# Patient Record
Sex: Male | Born: 1945 | Hispanic: No
Health system: Southern US, Community
[De-identification: ages and names within clinical notes are randomized; demographics above are authoritative.]

## PROBLEM LIST (undated history)

## (undated) DIAGNOSIS — M623 Immobility syndrome (paraplegic): Secondary | ICD-10-CM

## (undated) DIAGNOSIS — Z950 Presence of cardiac pacemaker: Secondary | ICD-10-CM

## (undated) DIAGNOSIS — C801 Malignant (primary) neoplasm, unspecified: Secondary | ICD-10-CM

## (undated) DIAGNOSIS — E7800 Pure hypercholesterolemia, unspecified: Secondary | ICD-10-CM

## (undated) HISTORY — PX: OTHER SURGICAL HISTORY: SHX169

## (undated) HISTORY — PX: PACEMAKER INSERTION: SHX728

## (undated) HISTORY — PX: PANCREAS SURGERY: SHX731

---

## 2004-04-29 ENCOUNTER — Emergency Department: Payer: Self-pay | Admitting: Emergency Medicine

## 2004-04-30 ENCOUNTER — Inpatient Hospital Stay: Payer: Self-pay | Admitting: Anesthesiology

## 2004-06-06 ENCOUNTER — Emergency Department: Payer: Self-pay | Admitting: Emergency Medicine

## 2004-06-24 ENCOUNTER — Emergency Department: Payer: Self-pay | Admitting: Emergency Medicine

## 2004-07-24 ENCOUNTER — Emergency Department: Payer: Self-pay | Admitting: Emergency Medicine

## 2004-08-15 ENCOUNTER — Emergency Department: Payer: Self-pay | Admitting: Emergency Medicine

## 2006-02-26 ENCOUNTER — Encounter
Admission: RE | Admit: 2006-02-26 | Discharge: 2006-03-25 | Payer: Self-pay | Admitting: Physical Medicine and Rehabilitation

## 2007-05-18 ENCOUNTER — Inpatient Hospital Stay: Payer: Self-pay | Admitting: Specialist

## 2007-05-18 ENCOUNTER — Other Ambulatory Visit: Payer: Self-pay

## 2007-05-26 ENCOUNTER — Ambulatory Visit: Payer: Self-pay | Admitting: Family Medicine

## 2009-11-05 ENCOUNTER — Inpatient Hospital Stay: Payer: Self-pay | Admitting: *Deleted

## 2010-08-14 ENCOUNTER — Ambulatory Visit: Payer: Self-pay | Admitting: Family Medicine

## 2010-11-29 ENCOUNTER — Emergency Department: Payer: Self-pay | Admitting: Emergency Medicine

## 2011-04-22 ENCOUNTER — Inpatient Hospital Stay: Payer: Self-pay | Admitting: Internal Medicine

## 2011-04-22 DIAGNOSIS — I499 Cardiac arrhythmia, unspecified: Secondary | ICD-10-CM

## 2012-06-29 ENCOUNTER — Emergency Department: Payer: Self-pay | Admitting: Emergency Medicine

## 2012-06-29 LAB — BASIC METABOLIC PANEL
BUN: 18 mg/dL (ref 7–18)
Calcium, Total: 9.1 mg/dL (ref 8.5–10.1)
Chloride: 104 mmol/L (ref 98–107)
Co2: 22 mmol/L (ref 21–32)
Potassium: 3.7 mmol/L (ref 3.5–5.1)
Sodium: 137 mmol/L (ref 136–145)

## 2012-06-29 LAB — CBC
HCT: 40.5 % (ref 40.0–52.0)
HGB: 12.8 g/dL — ABNORMAL LOW (ref 13.0–18.0)
MCH: 28 pg (ref 26.0–34.0)
MCV: 89 fL (ref 80–100)
RBC: 4.57 10*6/uL (ref 4.40–5.90)
RDW: 14.4 % (ref 11.5–14.5)

## 2012-07-04 LAB — CULTURE, BLOOD (SINGLE)

## 2012-12-23 ENCOUNTER — Ambulatory Visit: Payer: Self-pay

## 2013-01-22 ENCOUNTER — Ambulatory Visit: Payer: Self-pay | Admitting: Gastroenterology

## 2013-01-26 ENCOUNTER — Ambulatory Visit: Payer: Self-pay | Admitting: Urology

## 2013-01-26 LAB — BASIC METABOLIC PANEL
BUN: 12 mg/dL (ref 7–18)
Chloride: 107 mmol/L (ref 98–107)
Co2: 23 mmol/L (ref 21–32)
Creatinine: 0.96 mg/dL (ref 0.60–1.30)
Glucose: 138 mg/dL — ABNORMAL HIGH (ref 65–99)
Potassium: 3.4 mmol/L — ABNORMAL LOW (ref 3.5–5.1)

## 2013-01-26 LAB — CBC WITH DIFFERENTIAL/PLATELET
Basophil %: 0.7 %
Eosinophil %: 7.7 %
HCT: 35.9 % — ABNORMAL LOW (ref 40.0–52.0)
HGB: 12.2 g/dL — ABNORMAL LOW (ref 13.0–18.0)
Lymphocyte #: 1.9 10*3/uL (ref 1.0–3.6)
MCHC: 33.9 g/dL (ref 32.0–36.0)
MCV: 86 fL (ref 80–100)
RBC: 4.15 10*6/uL — ABNORMAL LOW (ref 4.40–5.90)

## 2013-02-06 ENCOUNTER — Emergency Department: Payer: Self-pay | Admitting: Emergency Medicine

## 2013-02-06 LAB — URINALYSIS, COMPLETE
Glucose,UR: NEGATIVE mg/dL (ref 0–75)
Ph: 6 (ref 4.5–8.0)
Protein: 30
RBC,UR: 174 /HPF (ref 0–5)
Specific Gravity: 1.01 (ref 1.003–1.030)
Squamous Epithelial: 1
WBC UR: 85 /HPF (ref 0–5)

## 2013-02-06 LAB — COMPREHENSIVE METABOLIC PANEL
Albumin: 3 g/dL — ABNORMAL LOW (ref 3.4–5.0)
Alkaline Phosphatase: 96 U/L (ref 50–136)
BUN: 24 mg/dL — ABNORMAL HIGH (ref 7–18)
Chloride: 105 mmol/L (ref 98–107)
Co2: 22 mmol/L (ref 21–32)
EGFR (Non-African Amer.): 50 — ABNORMAL LOW
Glucose: 88 mg/dL (ref 65–99)
SGPT (ALT): 38 U/L (ref 12–78)
Sodium: 134 mmol/L — ABNORMAL LOW (ref 136–145)
Total Protein: 7.5 g/dL (ref 6.4–8.2)

## 2013-02-06 LAB — CBC WITH DIFFERENTIAL/PLATELET
Basophil %: 1.1 %
Eosinophil #: 0.9 10*3/uL — ABNORMAL HIGH (ref 0.0–0.7)
Eosinophil %: 5.7 %
HCT: 37.1 % — ABNORMAL LOW (ref 40.0–52.0)
HGB: 12.7 g/dL — ABNORMAL LOW (ref 13.0–18.0)
Lymphocyte %: 16 %
MCHC: 34.1 g/dL (ref 32.0–36.0)
MCV: 87 fL (ref 80–100)
Monocyte #: 1.1 x10 3/mm — ABNORMAL HIGH (ref 0.2–1.0)
Monocyte %: 7.5 %
Neutrophil #: 10.5 10*3/uL — ABNORMAL HIGH (ref 1.4–6.5)
Neutrophil %: 69.7 %
WBC: 15 10*3/uL — ABNORMAL HIGH (ref 3.8–10.6)

## 2013-02-09 ENCOUNTER — Ambulatory Visit: Payer: Self-pay | Admitting: Urology

## 2013-02-09 LAB — PROTIME-INR
INR: 1.2
Prothrombin Time: 15.1 secs — ABNORMAL HIGH (ref 11.5–14.7)

## 2013-02-27 ENCOUNTER — Ambulatory Visit: Payer: Self-pay | Admitting: Surgery

## 2013-02-27 ENCOUNTER — Ambulatory Visit: Payer: Self-pay | Admitting: Internal Medicine

## 2013-03-04 ENCOUNTER — Inpatient Hospital Stay: Payer: Self-pay | Admitting: Internal Medicine

## 2013-03-04 LAB — URINALYSIS, COMPLETE
Glucose,UR: NEGATIVE mg/dL (ref 0–75)
Hyaline Cast: 2
Ketone: NEGATIVE
Protein: 100
Specific Gravity: 1.01 (ref 1.003–1.030)
Squamous Epithelial: <1

## 2013-03-04 LAB — CBC WITH DIFFERENTIAL/PLATELET
Basophil #: 0 10*3/uL (ref 0.0–0.1)
Basophil %: 0.2 %
Eosinophil #: 0.3 10*3/uL (ref 0.0–0.7)
Eosinophil %: 1.4 %
HCT: 37.6 % — ABNORMAL LOW (ref 40.0–52.0)
HGB: 12.6 g/dL — ABNORMAL LOW (ref 13.0–18.0)
Lymphocyte #: 1.4 10*3/uL (ref 1.0–3.6)
Lymphocyte %: 6.4 %
MCH: 29.1 pg (ref 26.0–34.0)
MCHC: 33.5 g/dL (ref 32.0–36.0)
MCV: 87 fL (ref 80–100)
Monocyte #: 1.5 x10 3/mm — ABNORMAL HIGH (ref 0.2–1.0)
Monocyte %: 6.9 %
Neutrophil #: 18.8 10*3/uL — ABNORMAL HIGH (ref 1.4–6.5)
Neutrophil %: 85.1 %
Platelet: 484 10*3/uL — ABNORMAL HIGH (ref 150–440)
RBC: 4.33 10*6/uL — ABNORMAL LOW (ref 4.40–5.90)
RDW: 14.9 % — ABNORMAL HIGH (ref 11.5–14.5)
WBC: 22.1 10*3/uL — ABNORMAL HIGH (ref 3.8–10.6)

## 2013-03-04 LAB — COMPREHENSIVE METABOLIC PANEL
Albumin: 2.8 g/dL — ABNORMAL LOW (ref 3.4–5.0)
Alkaline Phosphatase: 109 U/L (ref 50–136)
Anion Gap: 9 (ref 7–16)
BUN: 21 mg/dL — ABNORMAL HIGH (ref 7–18)
Bilirubin,Total: 0.8 mg/dL (ref 0.2–1.0)
Calcium, Total: 8.7 mg/dL (ref 8.5–10.1)
Chloride: 104 mmol/L (ref 98–107)
Co2: 22 mmol/L (ref 21–32)
Creatinine: 1.49 mg/dL — ABNORMAL HIGH (ref 0.60–1.30)
EGFR (African American): 57 — ABNORMAL LOW
EGFR (Non-African Amer.): 49 — ABNORMAL LOW
Glucose: 116 mg/dL — ABNORMAL HIGH (ref 65–99)
Osmolality: 274 (ref 275–301)
Potassium: 3.6 mmol/L (ref 3.5–5.1)
SGOT(AST): 24 U/L (ref 15–37)
SGPT (ALT): 31 U/L (ref 12–78)
Sodium: 135 mmol/L — ABNORMAL LOW (ref 136–145)
Total Protein: 7.5 g/dL (ref 6.4–8.2)

## 2013-03-04 LAB — PROTIME-INR: Prothrombin Time: 22.8 secs — ABNORMAL HIGH (ref 11.5–14.7)

## 2013-03-05 LAB — BASIC METABOLIC PANEL
Anion Gap: 10 (ref 7–16)
BUN: 20 mg/dL — ABNORMAL HIGH (ref 7–18)
Calcium, Total: 9 mg/dL (ref 8.5–10.1)
Co2: 21 mmol/L (ref 21–32)
Creatinine: 1.25 mg/dL (ref 0.60–1.30)
EGFR (African American): >60
EGFR (Non-African Amer.): >60
Osmolality: 270 (ref 275–301)
Potassium: 3.7 mmol/L (ref 3.5–5.1)

## 2013-03-05 LAB — CBC WITH DIFFERENTIAL/PLATELET
Eosinophil #: 0.3 10*3/uL (ref 0.0–0.7)
Eosinophil %: 1.6 %
HGB: 12.4 g/dL — ABNORMAL LOW (ref 13.0–18.0)
Lymphocyte #: 1.7 10*3/uL (ref 1.0–3.6)
Lymphocyte %: 7.9 %
MCHC: 34.2 g/dL (ref 32.0–36.0)
MCV: 85 fL (ref 80–100)
Monocyte %: 6.8 %
Platelet: 493 10*3/uL — ABNORMAL HIGH (ref 150–440)
RBC: 4.24 10*6/uL — ABNORMAL LOW (ref 4.40–5.90)
RDW: 14.7 % — ABNORMAL HIGH (ref 11.5–14.5)
WBC: 21 10*3/uL — ABNORMAL HIGH (ref 3.8–10.6)

## 2013-03-05 LAB — PROTIME-INR: INR: 3.4

## 2013-03-06 LAB — CBC WITH DIFFERENTIAL/PLATELET
Basophil #: 0 10*3/uL (ref 0.0–0.1)
Basophil %: 0.1 %
Eosinophil #: 0 10*3/uL (ref 0.0–0.7)
Eosinophil %: 0 %
HCT: 35.2 % — ABNORMAL LOW (ref 40.0–52.0)
HGB: 12 g/dL — ABNORMAL LOW (ref 13.0–18.0)
Lymphocyte #: 0.9 10*3/uL — ABNORMAL LOW (ref 1.0–3.6)
Lymphocyte %: 8 %
MCH: 29.3 pg (ref 26.0–34.0)
MCHC: 34.2 g/dL (ref 32.0–36.0)
MCV: 85 fL (ref 80–100)
Monocyte #: 0.1 x10 3/mm — ABNORMAL LOW (ref 0.2–1.0)
Monocyte %: 1.2 %
Neutrophil #: 10.2 10*3/uL — ABNORMAL HIGH (ref 1.4–6.5)
Neutrophil %: 90.7 %
Platelet: 557 10*3/uL — ABNORMAL HIGH (ref 150–440)
RBC: 4.12 10*6/uL — ABNORMAL LOW (ref 4.40–5.90)
RDW: 14.8 % — ABNORMAL HIGH (ref 11.5–14.5)
WBC: 11.2 10*3/uL — ABNORMAL HIGH (ref 3.8–10.6)

## 2013-03-06 LAB — BASIC METABOLIC PANEL
Anion Gap: 11 (ref 7–16)
BUN: 16 mg/dL (ref 7–18)
Calcium, Total: 8.6 mg/dL (ref 8.5–10.1)
Co2: 21 mmol/L (ref 21–32)
Creatinine: 1.17 mg/dL (ref 0.60–1.30)
EGFR (African American): >60
EGFR (Non-African Amer.): >60
Osmolality: 277 (ref 275–301)
Potassium: 3.6 mmol/L (ref 3.5–5.1)
Sodium: 137 mmol/L (ref 136–145)

## 2013-03-06 LAB — PROTIME-INR
INR: 3
Prothrombin Time: 30.4 secs — ABNORMAL HIGH (ref 11.5–14.7)

## 2013-03-07 LAB — PROTIME-INR: Prothrombin Time: 22 secs — ABNORMAL HIGH (ref 11.5–14.7)

## 2013-03-08 LAB — CBC WITH DIFFERENTIAL/PLATELET
Basophil #: 0 10*3/uL (ref 0.0–0.1)
Basophil %: 0.1 %
Eosinophil #: 0 10*3/uL (ref 0.0–0.7)
Lymphocyte #: 1.9 10*3/uL (ref 1.0–3.6)
Lymphocyte %: 13.3 %
MCH: 29.3 pg (ref 26.0–34.0)
Monocyte #: 1.1 x10 3/mm — ABNORMAL HIGH (ref 0.2–1.0)
Monocyte %: 7.9 %
Platelet: 512 10*3/uL — ABNORMAL HIGH (ref 150–440)
RBC: 3.85 10*6/uL — ABNORMAL LOW (ref 4.40–5.90)
RDW: 14.8 % — ABNORMAL HIGH (ref 11.5–14.5)
WBC: 14.1 10*3/uL — ABNORMAL HIGH (ref 3.8–10.6)

## 2013-03-08 LAB — BASIC METABOLIC PANEL
BUN: 21 mg/dL — ABNORMAL HIGH (ref 7–18)
Calcium, Total: 8 mg/dL — ABNORMAL LOW (ref 8.5–10.1)
Chloride: 105 mmol/L (ref 98–107)
Co2: 22 mmol/L (ref 21–32)
Glucose: 99 mg/dL (ref 65–99)
Potassium: 3.3 mmol/L — ABNORMAL LOW (ref 3.5–5.1)

## 2013-03-09 LAB — BASIC METABOLIC PANEL
Anion Gap: 6 — ABNORMAL LOW (ref 7–16)
Calcium, Total: 7.9 mg/dL — ABNORMAL LOW (ref 8.5–10.1)
Creatinine: 1.13 mg/dL (ref 0.60–1.30)
EGFR (African American): >60
EGFR (Non-African Amer.): >60
Glucose: 97 mg/dL (ref 65–99)
Potassium: 3.2 mmol/L — ABNORMAL LOW (ref 3.5–5.1)

## 2013-03-09 LAB — CBC WITH DIFFERENTIAL/PLATELET
Basophil #: 0 10*3/uL (ref 0.0–0.1)
Basophil %: 0.1 %
Eosinophil %: 1.3 %
HCT: 33.1 % — ABNORMAL LOW (ref 40.0–52.0)
HGB: 11.3 g/dL — ABNORMAL LOW (ref 13.0–18.0)
Lymphocyte #: 1.6 10*3/uL (ref 1.0–3.6)
MCH: 29.3 pg (ref 26.0–34.0)
MCHC: 34.2 g/dL (ref 32.0–36.0)
MCV: 86 fL (ref 80–100)
Monocyte #: 1 x10 3/mm (ref 0.2–1.0)
Neutrophil #: 12.1 10*3/uL — ABNORMAL HIGH (ref 1.4–6.5)
RBC: 3.86 10*6/uL — ABNORMAL LOW (ref 4.40–5.90)
WBC: 14.9 10*3/uL — ABNORMAL HIGH (ref 3.8–10.6)

## 2013-03-09 LAB — PROTIME-INR: Prothrombin Time: 18.7 secs — ABNORMAL HIGH (ref 11.5–14.7)

## 2013-03-10 LAB — CBC WITH DIFFERENTIAL/PLATELET
Basophil #: 0 10*3/uL (ref 0.0–0.1)
Basophil %: 0.2 %
Eosinophil #: 0.7 10*3/uL (ref 0.0–0.7)
Eosinophil %: 4.2 %
HCT: 32.7 % — ABNORMAL LOW (ref 40.0–52.0)
HGB: 11.2 g/dL — ABNORMAL LOW (ref 13.0–18.0)
Lymphocyte #: 1.7 10*3/uL (ref 1.0–3.6)
Lymphocyte %: 10.7 %
MCH: 29.2 pg (ref 26.0–34.0)
MCV: 85 fL (ref 80–100)
Monocyte #: 1 x10 3/mm (ref 0.2–1.0)
Neutrophil %: 78.7 %
Platelet: 459 10*3/uL — ABNORMAL HIGH (ref 150–440)
RDW: 14.8 % — ABNORMAL HIGH (ref 11.5–14.5)
WBC: 15.5 10*3/uL — ABNORMAL HIGH (ref 3.8–10.6)

## 2013-03-10 LAB — BASIC METABOLIC PANEL
Anion Gap: 9 (ref 7–16)
BUN: 17 mg/dL (ref 7–18)
Calcium, Total: 8.1 mg/dL — ABNORMAL LOW (ref 8.5–10.1)
Co2: 24 mmol/L (ref 21–32)
Creatinine: 0.99 mg/dL (ref 0.60–1.30)
EGFR (Non-African Amer.): >60
Sodium: 135 mmol/L — ABNORMAL LOW (ref 136–145)

## 2013-03-11 LAB — BASIC METABOLIC PANEL WITH GFR
Anion Gap: 10
BUN: 13 mg/dL
Calcium, Total: 7.7 mg/dL — ABNORMAL LOW
Chloride: 102 mmol/L
Co2: 24 mmol/L
Creatinine: 0.99 mg/dL
EGFR (African American): >60
EGFR (Non-African Amer.): >60
Glucose: 86 mg/dL
Osmolality: 271
Potassium: 3 mmol/L — ABNORMAL LOW
Sodium: 136 mmol/L

## 2013-03-11 LAB — CBC WITH DIFFERENTIAL/PLATELET
Basophil #: 0 x10 3/mm 3
Basophil %: 0.1 %
Eosinophil #: 1.2 x10 3/mm 3 — ABNORMAL HIGH
Eosinophil %: 7.2 %
HCT: 32 % — ABNORMAL LOW
HGB: 10.8 g/dL — ABNORMAL LOW
Lymphocyte %: 10.2 %
Lymphs Abs: 1.7 x10 3/mm 3
MCH: 29.1 pg
MCHC: 33.6 g/dL
MCV: 87 fL
Monocyte #: 0.8 "x10 3/mm "
Monocyte %: 4.7 %
Neutrophil #: 12.9 x10 3/mm 3 — ABNORMAL HIGH
Neutrophil %: 77.8 %
Platelet: 416 x10 3/mm 3
RBC: 3.7 x10 6/mm 3 — ABNORMAL LOW
RDW: 14.7 % — ABNORMAL HIGH
WBC: 16.6 x10 3/mm 3 — ABNORMAL HIGH

## 2013-03-11 LAB — PROTIME-INR: INR: 1.7

## 2013-03-12 LAB — CBC WITH DIFFERENTIAL/PLATELET
Basophil %: 0.2 %
Eosinophil #: 1.3 10*3/uL — ABNORMAL HIGH (ref 0.0–0.7)
Eosinophil %: 7.6 %
HCT: 32.8 % — ABNORMAL LOW (ref 40.0–52.0)
Lymphocyte %: 12.3 %
MCH: 29.3 pg (ref 26.0–34.0)
MCV: 86 fL (ref 80–100)
Monocyte #: 0.9 x10 3/mm (ref 0.2–1.0)
Neutrophil #: 12.4 10*3/uL — ABNORMAL HIGH (ref 1.4–6.5)
Neutrophil %: 74.7 %
RBC: 3.79 10*6/uL — ABNORMAL LOW (ref 4.40–5.90)
RDW: 14.5 % (ref 11.5–14.5)
WBC: 16.6 10*3/uL — ABNORMAL HIGH (ref 3.8–10.6)

## 2013-03-12 LAB — MAGNESIUM: Magnesium: 0.8 mg/dL — ABNORMAL LOW

## 2013-03-12 LAB — PROTIME-INR: Prothrombin Time: 20.9 secs — ABNORMAL HIGH (ref 11.5–14.7)

## 2013-03-13 LAB — PATHOLOGY REPORT

## 2013-03-20 ENCOUNTER — Inpatient Hospital Stay: Payer: Self-pay | Admitting: Family Medicine

## 2013-03-20 LAB — CBC WITH DIFFERENTIAL/PLATELET
Eosinophil %: 5.1 %
HCT: 28.9 % — ABNORMAL LOW (ref 40.0–52.0)
HGB: 9.8 g/dL — ABNORMAL LOW (ref 13.0–18.0)
MCH: 29.2 pg (ref 26.0–34.0)
MCHC: 34 g/dL (ref 32.0–36.0)
MCV: 86 fL (ref 80–100)
Monocyte #: 1.4 x10 3/mm — ABNORMAL HIGH (ref 0.2–1.0)
Neutrophil #: 13.9 10*3/uL — ABNORMAL HIGH (ref 1.4–6.5)
Neutrophil %: 72.6 %
RDW: 14.6 % — ABNORMAL HIGH (ref 11.5–14.5)
WBC: 19.1 10*3/uL — ABNORMAL HIGH (ref 3.8–10.6)

## 2013-03-20 LAB — URINALYSIS, COMPLETE
Bilirubin,UR: NEGATIVE
Ketone: NEGATIVE
Ph: 6 (ref 4.5–8.0)
Protein: 100
RBC,UR: 392 /HPF (ref 0–5)
Squamous Epithelial: NONE SEEN
Transitional Epi: 3
WBC UR: 11589 /HPF (ref 0–5)

## 2013-03-20 LAB — COMPREHENSIVE METABOLIC PANEL
Albumin: 2.2 g/dL — ABNORMAL LOW (ref 3.4–5.0)
BUN: 15 mg/dL (ref 7–18)
Bilirubin,Total: 0.3 mg/dL (ref 0.2–1.0)
Calcium, Total: 8.6 mg/dL (ref 8.5–10.1)
Chloride: 103 mmol/L (ref 98–107)
Glucose: 120 mg/dL — ABNORMAL HIGH (ref 65–99)
Osmolality: 272 (ref 275–301)
Potassium: 3.8 mmol/L (ref 3.5–5.1)
SGOT(AST): 21 U/L (ref 15–37)

## 2013-03-20 LAB — PROTIME-INR: INR: 2.6

## 2013-03-21 LAB — CBC WITH DIFFERENTIAL/PLATELET
Basophil #: 0.1 10*3/uL (ref 0.0–0.1)
Basophil %: 0.4 %
Eosinophil %: 5.2 %
HCT: 29.8 % — ABNORMAL LOW (ref 40.0–52.0)
HGB: 9.9 g/dL — ABNORMAL LOW (ref 13.0–18.0)
MCH: 28.6 pg (ref 26.0–34.0)
MCHC: 33.1 g/dL (ref 32.0–36.0)
MCV: 86 fL (ref 80–100)
Monocyte #: 1.4 x10 3/mm — ABNORMAL HIGH (ref 0.2–1.0)
Monocyte %: 8.4 %
Neutrophil #: 11.4 10*3/uL — ABNORMAL HIGH (ref 1.4–6.5)
Neutrophil %: 70.5 %
RBC: 3.45 10*6/uL — ABNORMAL LOW (ref 4.40–5.90)
RDW: 14.8 % — ABNORMAL HIGH (ref 11.5–14.5)
WBC: 16.1 10*3/uL — ABNORMAL HIGH (ref 3.8–10.6)

## 2013-03-21 LAB — BASIC METABOLIC PANEL
Anion Gap: 7 (ref 7–16)
Chloride: 108 mmol/L — ABNORMAL HIGH (ref 98–107)
Co2: 24 mmol/L (ref 21–32)
EGFR (African American): >60
Glucose: 78 mg/dL (ref 65–99)

## 2013-03-22 LAB — CLOSTRIDIUM DIFFICILE BY PCR

## 2013-03-22 LAB — PROTIME-INR
INR: 2.7
Prothrombin Time: 28 secs — ABNORMAL HIGH (ref 11.5–14.7)

## 2013-03-23 LAB — PROTIME-INR
INR: 2.5
Prothrombin Time: 26.7 secs — ABNORMAL HIGH (ref 11.5–14.7)

## 2013-03-24 LAB — CBC WITH DIFFERENTIAL/PLATELET
Basophil #: 0.1 10*3/uL (ref 0.0–0.1)
Basophil %: 0.5 %
Eosinophil %: 8.7 %
HCT: 28.1 % — ABNORMAL LOW (ref 40.0–52.0)
Lymphocyte #: 2.2 10*3/uL (ref 1.0–3.6)
MCH: 29.5 pg (ref 26.0–34.0)
MCHC: 34.1 g/dL (ref 32.0–36.0)
MCV: 86 fL (ref 80–100)
Monocyte %: 8.4 %
Neutrophil #: 7.3 10*3/uL — ABNORMAL HIGH (ref 1.4–6.5)
Platelet: 408 10*3/uL (ref 150–440)
RBC: 3.25 10*6/uL — ABNORMAL LOW (ref 4.40–5.90)
RDW: 14.9 % — ABNORMAL HIGH (ref 11.5–14.5)
WBC: 11.6 10*3/uL — ABNORMAL HIGH (ref 3.8–10.6)

## 2013-03-24 LAB — PROTIME-INR
INR: 2.5
Prothrombin Time: 26 secs — ABNORMAL HIGH (ref 11.5–14.7)

## 2013-03-25 ENCOUNTER — Ambulatory Visit: Payer: Self-pay | Admitting: Internal Medicine

## 2013-03-25 LAB — PROTIME-INR: Prothrombin Time: 25.2 secs — ABNORMAL HIGH (ref 11.5–14.7)

## 2013-04-02 ENCOUNTER — Encounter (HOSPITAL_COMMUNITY): Payer: Self-pay | Admitting: Emergency Medicine

## 2013-04-02 ENCOUNTER — Emergency Department (HOSPITAL_COMMUNITY)
Admission: EM | Admit: 2013-04-02 | Discharge: 2013-04-02 | Disposition: A | Discharge status: Home / Self Care | Attending: Emergency Medicine | Admitting: Emergency Medicine

## 2013-04-02 DIAGNOSIS — Z792 Long term (current) use of antibiotics: Secondary | ICD-10-CM | POA: Insufficient documentation

## 2013-04-02 DIAGNOSIS — Z8739 Personal history of other diseases of the musculoskeletal system and connective tissue: Secondary | ICD-10-CM | POA: Insufficient documentation

## 2013-04-02 DIAGNOSIS — E7800 Pure hypercholesterolemia, unspecified: Secondary | ICD-10-CM | POA: Insufficient documentation

## 2013-04-02 DIAGNOSIS — Z85828 Personal history of other malignant neoplasm of skin: Secondary | ICD-10-CM | POA: Insufficient documentation

## 2013-04-02 DIAGNOSIS — Z7901 Long term (current) use of anticoagulants: Secondary | ICD-10-CM | POA: Insufficient documentation

## 2013-04-02 DIAGNOSIS — Z950 Presence of cardiac pacemaker: Secondary | ICD-10-CM | POA: Insufficient documentation

## 2013-04-02 DIAGNOSIS — Z79899 Other long term (current) drug therapy: Secondary | ICD-10-CM | POA: Insufficient documentation

## 2013-04-02 DIAGNOSIS — N390 Urinary tract infection, site not specified: Secondary | ICD-10-CM | POA: Insufficient documentation

## 2013-04-02 HISTORY — DX: Presence of cardiac pacemaker: Z95.0

## 2013-04-02 HISTORY — DX: Pure hypercholesterolemia, unspecified: E78.00

## 2013-04-02 HISTORY — DX: Malignant (primary) neoplasm, unspecified: C80.1

## 2013-04-02 HISTORY — DX: Immobility syndrome (paraplegic): M62.3

## 2013-04-02 NOTE — ED Provider Notes (Signed)
CSN: [id number]     Arrival date & time 04/02/13  1506 History   First MD Initiated Contact with Patient 04/02/13 1530     Chief Complaint  Patient presents with  . Pyelonephritis   (Consider location/radiation/quality/duration/timing/severity/associated sxs/prior Treatment) HPI..... level V caveat for severe illness.   Patient has terminal cancer and is on the hospice service.   Patient was brought to the hospital by his wife at the direction of the son who is his power of attorney.  The son is concerned about his urinary tract infection and the appropriateness of the antibiotic he is currently taking. Antibiotic is Duribax 500 mg q8h.   Urine cultures are pending. There's been no change in the patient's condition. Wife understands the concept of hospice.  Patient has no specific complaints  Past Medical History  Diagnosis Date  . Paraplegic immobility syndrome   . Pacemaker   . Hypercholesteremia   . Cancer    Past Surgical History  Procedure Laterality Date  . Fracture  of c4-5    . Pacemaker insertion    . Pancreas surgery    . Amputations of fingers rt hand     History reviewed. No pertinent family history. History  Substance Use Topics  . Smoking status: Passive Smoke Exposure - Never Smoker  . Smokeless tobacco: Not on file  . Alcohol Use: No    Review of Systems  Unable to perform ROS: Acuity of condition    Allergies  Morphine and related and Vancomycin  Home Medications   Current Outpatient Rx  Name  Route  Sig  Dispense  Refill  . atorvastatin (LIPITOR) 10 MG tablet   Oral   Take 10 mg by mouth at bedtime.         Marland Kitchen oxyCODONE-acetaminophen (PERCOCET/ROXICET) 5-325 MG per tablet   Oral   Take 1 tablet by mouth every 4 (four) hours as needed. For pain         . warfarin (COUMADIN) 1 MG tablet   Oral   Take 1 mg by mouth See admin instructions. Takes 1 mg on Mondays and Thursdays only. Take 3 mg on all other days in the evening as directed          . warfarin (COUMADIN) 3 MG tablet   Oral   Take 3 mg by mouth See admin instructions. Takes 1 mg on Mondays and Thursdays. Takes 3 mg on all other days in the evening         . amLODipine (NORVASC) 5 MG tablet   Oral   Take 5 mg by mouth daily.         Marland Kitchen amoxicillin-clavulanate (AUGMENTIN) 875-125 MG per tablet   Oral   Take 1 tablet by mouth 2 (two) times daily.         . baclofen (LIORESAL) 20 MG tablet   Oral   Take 1 tablet by mouth 4 (four) times daily.         Marland Kitchen LORazepam (ATIVAN) 0.5 MG tablet   Oral   Take 1 tablet by mouth daily as needed.         . nystatin (MYCOSTATIN) powder   Topical   Apply 1 application topically as directed.         . risperiDONE (RISPERDAL) 0.5 MG tablet   Oral   Take 1 tablet by mouth 2 (two) times daily.         . temazepam (RESTORIL) 15 MG capsule   Oral  Take 15 mg by mouth at bedtime.         . TRAVATAN Z 0.004 % SOLN ophthalmic solution   Both Eyes   Place 1 drop into both eyes daily.          BP 97/68  Pulse 70  Temp(Src) 97.2 F (36.2 C) (Oral)  Resp 20  Ht 5\' 10"  (1.778 m)  Wt 205 lb (92.987 kg)  BMI 29.41 kg/m2  SpO2 99% Physical Exam  Nursing note and vitals reviewed. Constitutional: He is oriented to person, place, and time.  Patient answers questions appropriately;   he is sitting in wheelchair.  HENT:  Head: Normocephalic and atraumatic.  Eyes: Conjunctivae and EOM are normal. Pupils are equal, round, and reactive to light.  Neck: Normal range of motion. Neck supple.  Cardiovascular: Normal rate, regular rhythm and normal heart sounds.   Pulmonary/Chest: Effort normal and breath sounds normal.  Abdominal: Soft. Bowel sounds are normal.  Musculoskeletal: Normal range of motion.  Neurological: He is alert and oriented to person, place, and time.  Paraplegic  Skin:  Bulky dressing on right upper extremity  Psychiatric: He has a normal mood and affect.    ED Course  Procedures (including  critical care time) Labs Review Labs Reviewed - No data to display Imaging Review No results found.  EKG Interpretation   None       MDM   1. Urinary tract infection    Patient is on intravenous antibiotics. Urine culture is pending. Patient is under the care of hospice. Medical screening exam completed. No acute interventions are necessary.    Donnetta Hutching, MD 04/02/13 [phone]

## 2013-04-02 NOTE — ED Notes (Addendum)
Pt is in w/c Paraplegic,  Has a foley.  Came from home, Being treated witrhDoribax IV, PICC line lt arm.    Family says he is here for eval of the Kidney infection.  Paraplegic for 9 years.  Answers some questions  Pt is under hospice care at home. Has lg bandage on rt hand, due to amputation. Family says pt bit his fingers and had to be amputated.

## 2013-04-02 NOTE — ED Notes (Signed)
Pt wife expressed understanding of discharge instructions and follow up with hospice RN ,

## 2013-04-02 NOTE — ED Notes (Signed)
Pt brought to er by wife and a caretaker with request for antibiotics, pt will answer some questions, has picc line in place to left arm, bulky dressing in place on right hand and arm due to recent amputation of fingers, foley in place draining yellow urine, pt is under hospice care for ?colon mass, wife pulled RN out of room and advised that the family had been told that there is nothing that can be done for pt, but son is wanting additional testing performed, son and family is concerned that pt has uti, pt's foley was changed Monday by hospice, has urine culture that will be resulted tomorrow per wife, is currently receiving doribax 500 mg IV three times a day, wife is requesting that no additional testing be performed, wife and caregiver both state that the pt is better today than he has been over the past few days, pt has periods of confusion that is normal for him.

## 2013-05-25 DEATH — deceased

## 2014-09-10 IMAGING — CR DG CHEST 1V PORT
1 series · 2 of 2 positions shown · non-contrast
Comparison: none

REASON FOR EXAM: altered mental status
COMMENTS:

[Series 1: ap · 0.17mm/px · 2 of 2 slices shown]
[im 1/2]
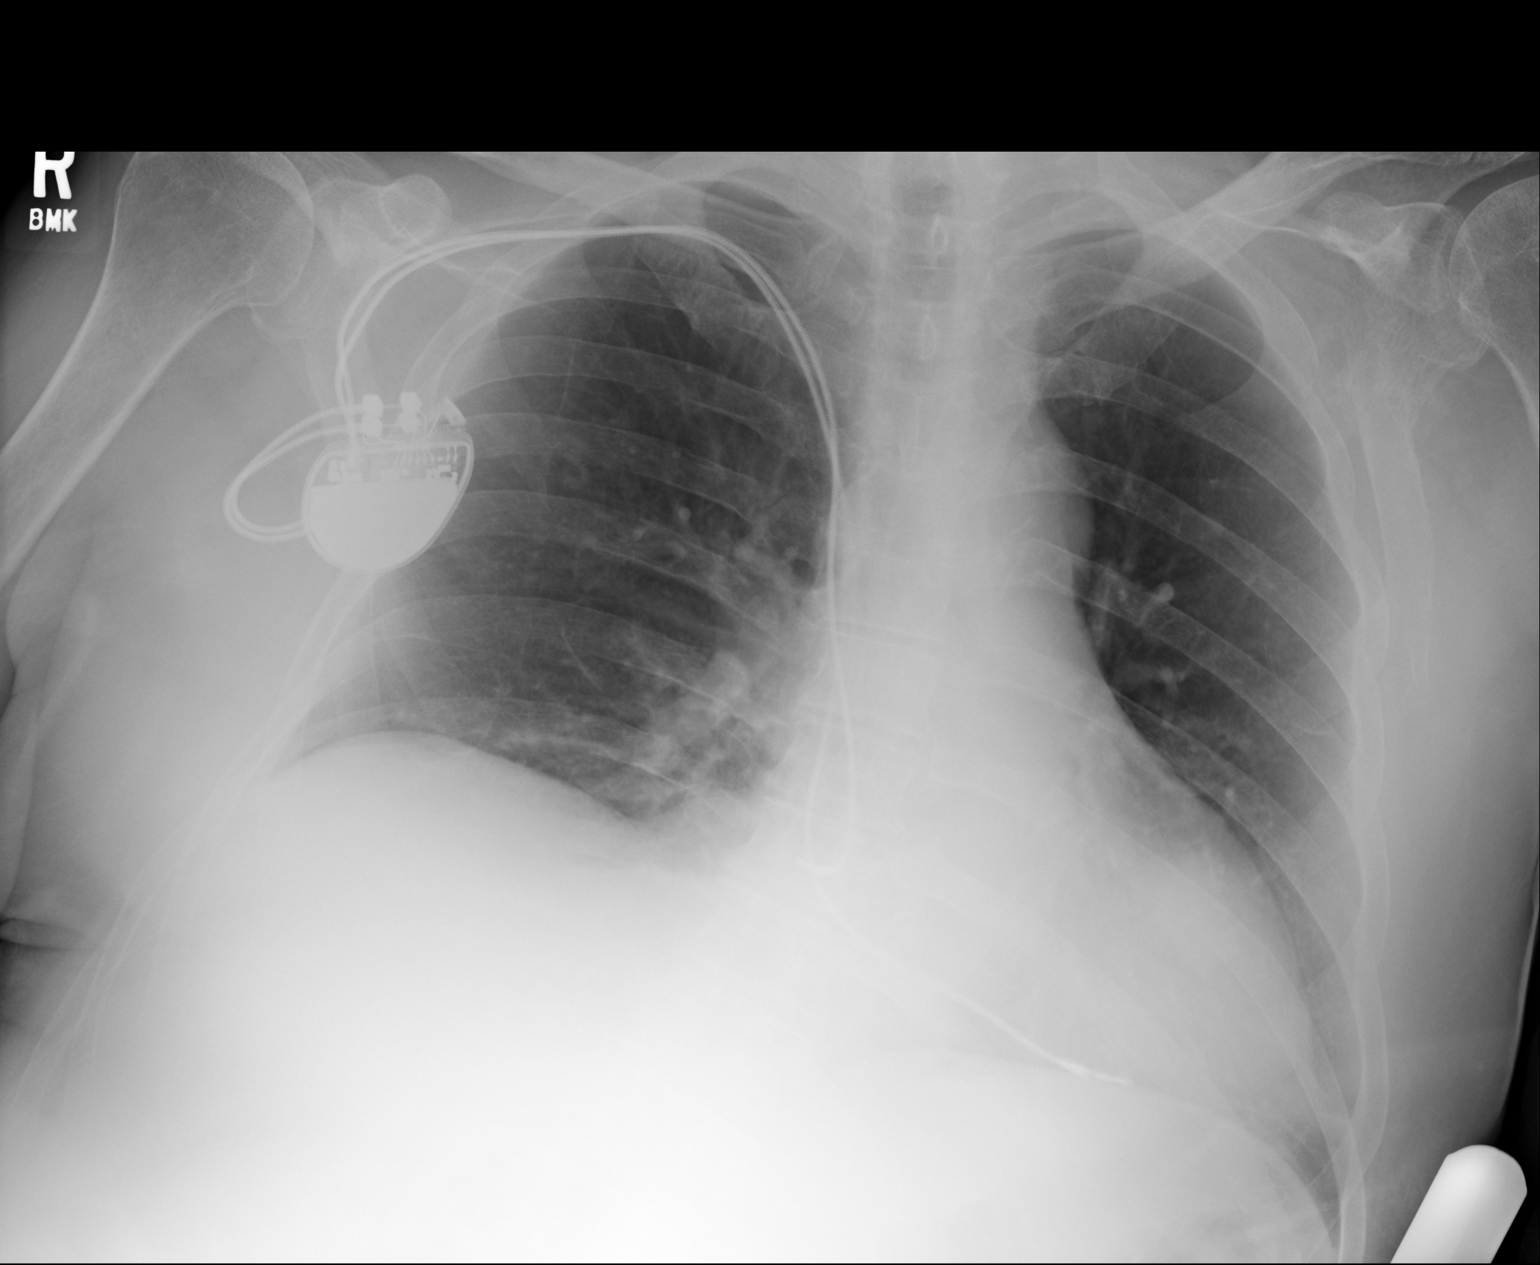
[im 2/2]
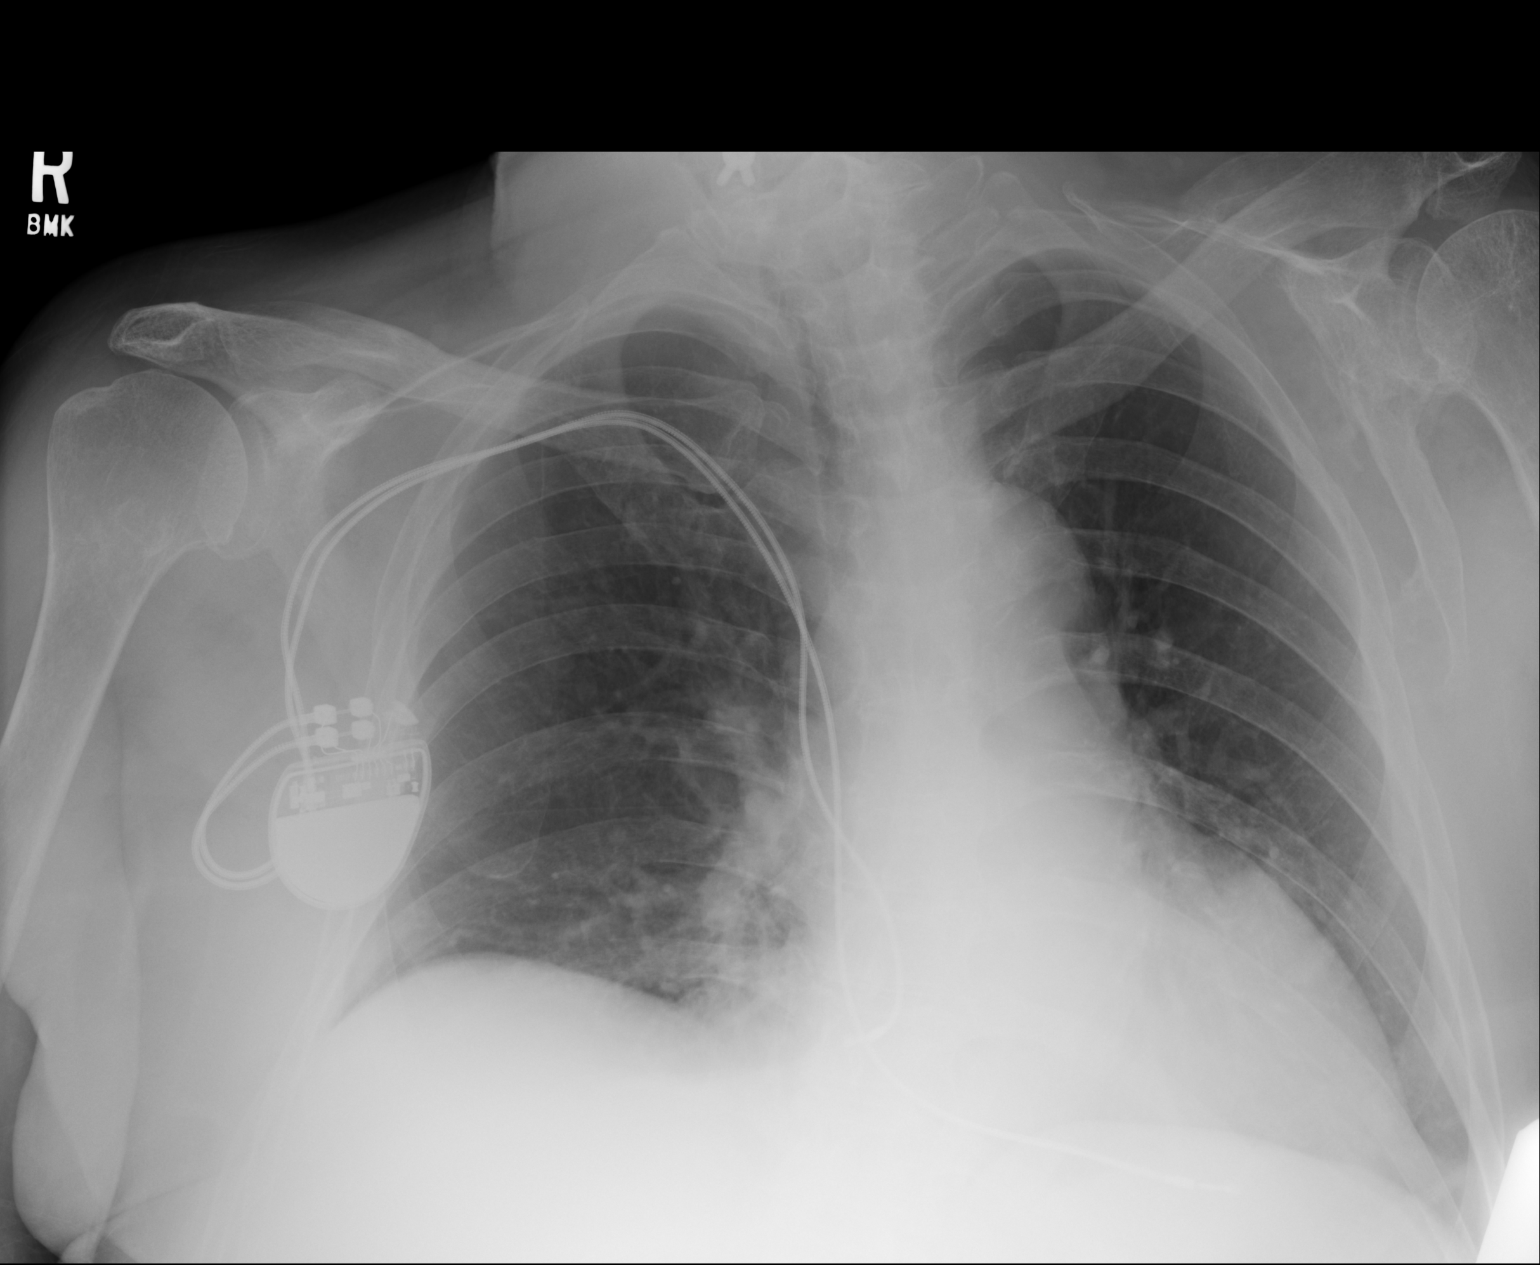

[2 of 2 positions shown; findings below may reference images not displayed]

PROCEDURE:     DXR - DXR PORTABLE CHEST SINGLE VIEW  - March 04, 2013  [DATE]

RESULT:     Comparison is made to the study April 22, 2011.

The patient is somewhat rotated on the study. The right hemidiaphragm is
slightly higher than the left. The lungs are clear. The cardiac silhouette
is top normal in size. The pulmonary vascularity is not engorged. A
permanent pacemaker is in place.
IMPRESSION: There is no definite evidence of pneumonia nor CHF nor
other acute cardiopulmonary abnormality.

[REDACTED]

## 2014-09-10 IMAGING — CT CT HEAD WITHOUT CONTRAST
1 series · 1 of 1 positions shown · non-contrast
Comparison: none

REASON FOR EXAM: altered mental status
COMMENTS:

[Series 1: topogram 1.0 t20s · sagittal · 1.0mm · 1.00mm/px · 1 of 1 slices shown]
[im 1/1]
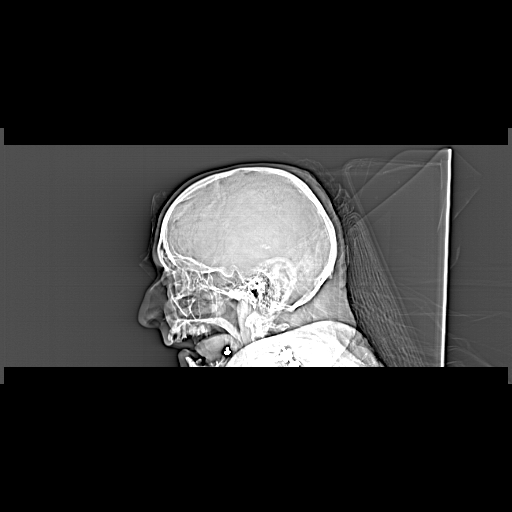

[1 of 1 positions shown; findings below may reference images not displayed]

PROCEDURE:     CT  - CT HEAD WITHOUT CONTRAST  - March 04, 2013  [DATE]

RESULT:     Noncontrast emergent CT of the brain is performed in the
standard fashion. There is prominence of the ventricles and sulci consistent
with atrophy. There is no intracranial hemorrhage, mass, mass effect or
midline shift. No evolving infarct is present. The calvarium appears intact.
The sinuses and mastoid air cells are clear.
IMPRESSION: 1. Atrophy with chronic microvascular ischemic disease. No acute
intracranial abnormality.

[REDACTED]

## 2014-09-10 IMAGING — CT CT HEAD WITHOUT CONTRAST
2 series · 16 of 30 positions shown, 20 images · non-contrast
Comparison: none

REASON FOR EXAM: altered mental status
COMMENTS:

[Series 2: without · axial · non-contrast · 0.42mm/px · z∈[+924,+1054]mm · 13 of 32 slices shown, 17 images]
[im 3/32  brain]
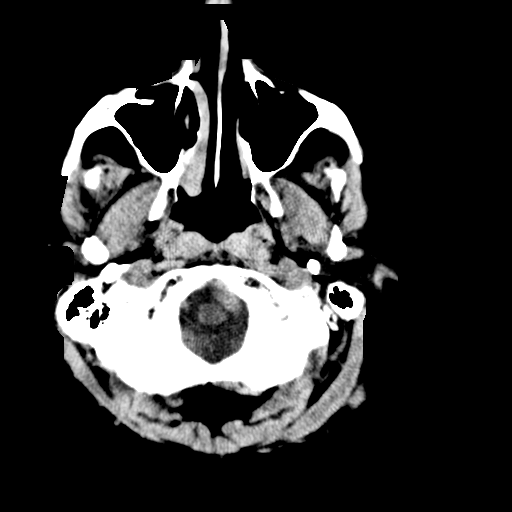
[im 3/32  bone]
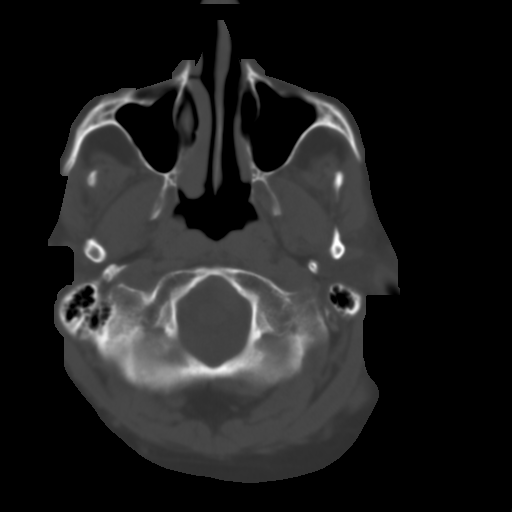
[im 5/32  brain]
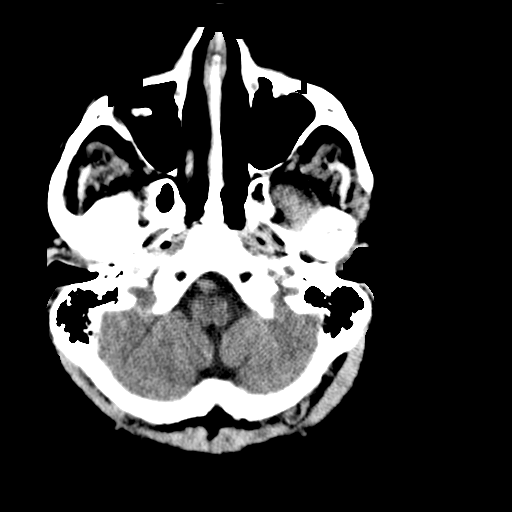
[im 7/32  brain]
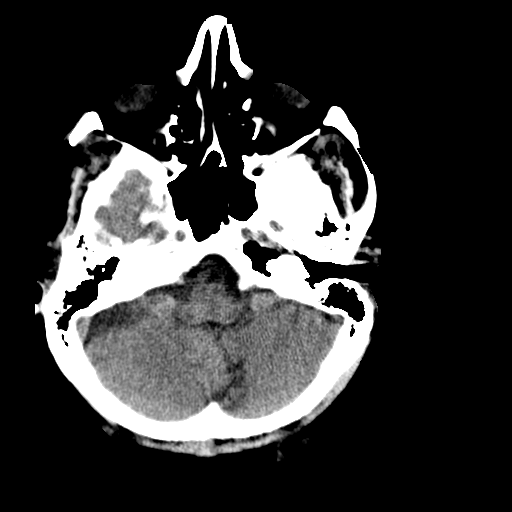
[im 9/32  brain]
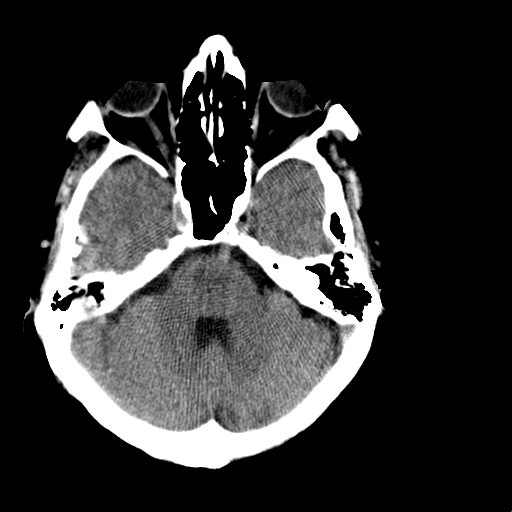
[im 12/32  brain]
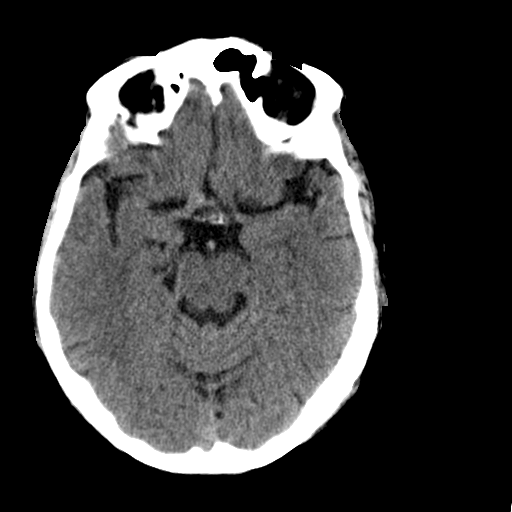
[im 12/32  bone]
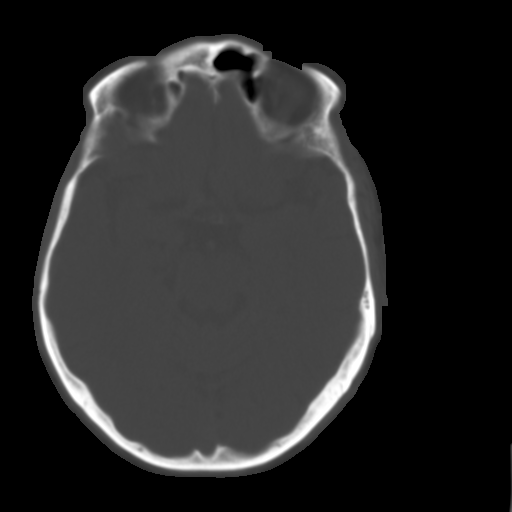
[im 14/32  brain]
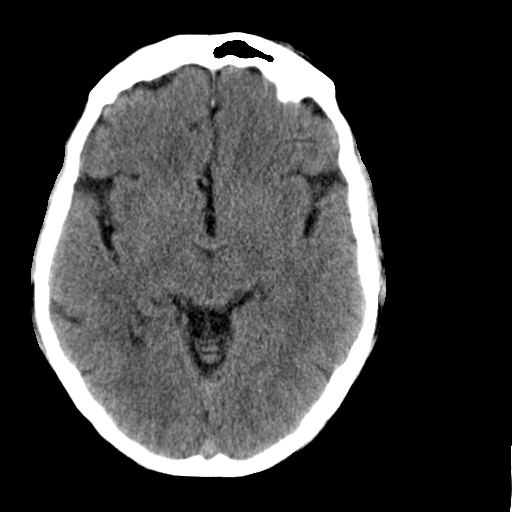
[im 16/32  brain]
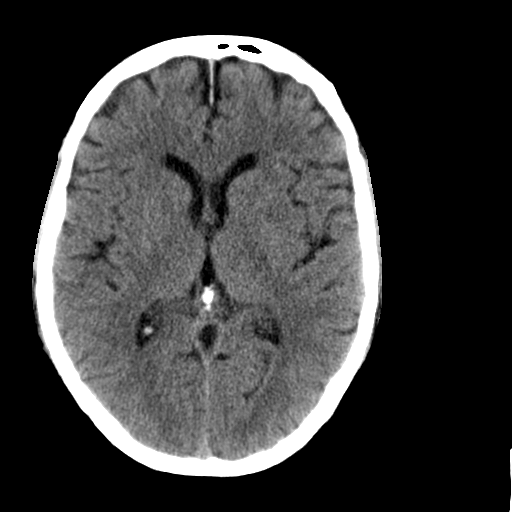
[im 18/32  brain]
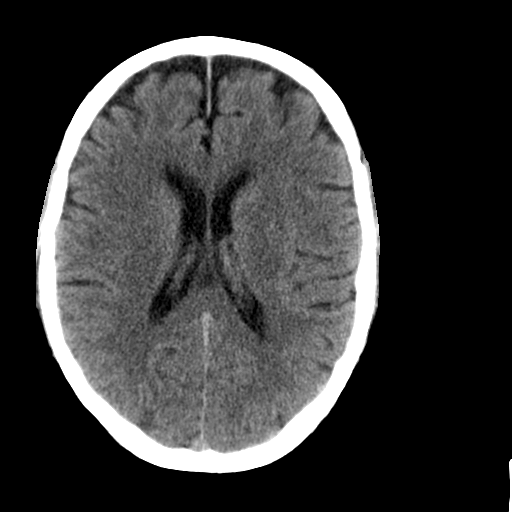
[im 20/32  brain]
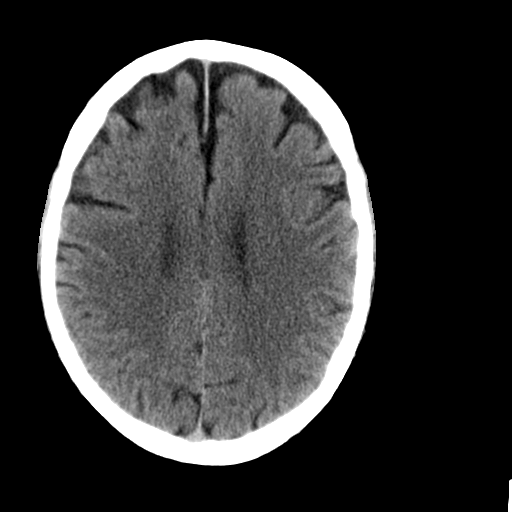
[im 20/32  bone]
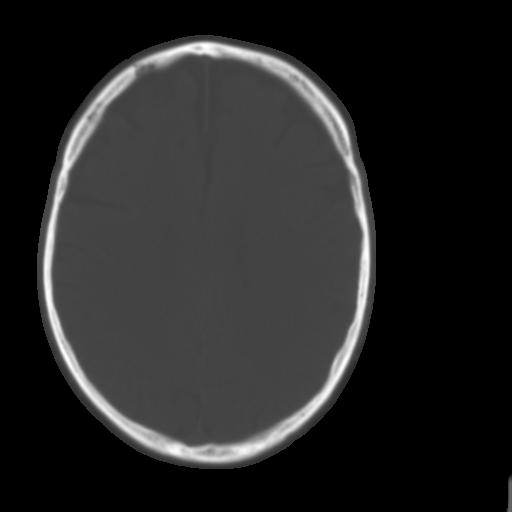
[im 23/32  brain]
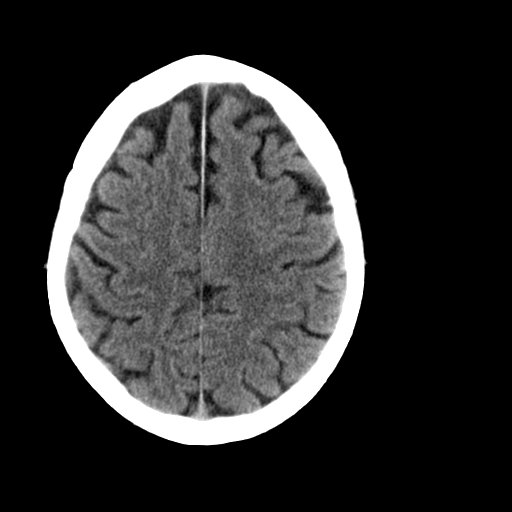
[im 25/32  brain]
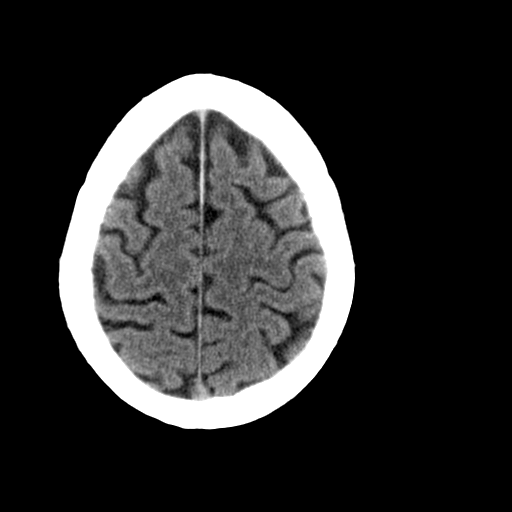
[im 27/32  brain]
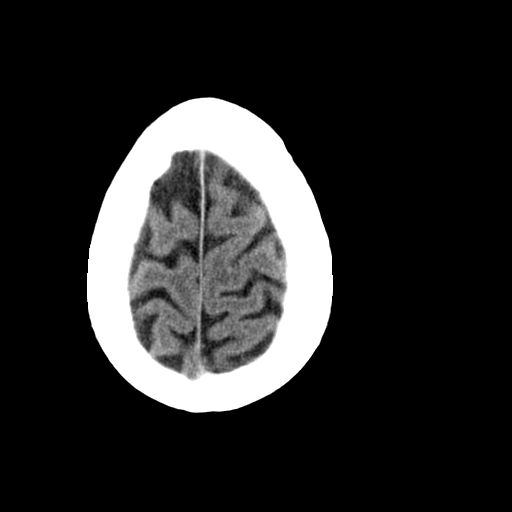
[im 29/32  brain]
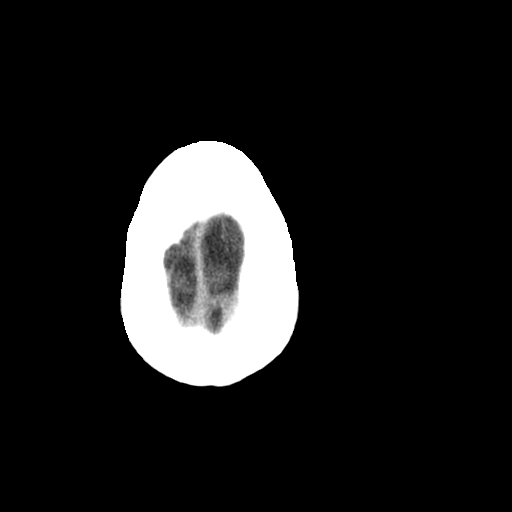
[im 29/32  bone]
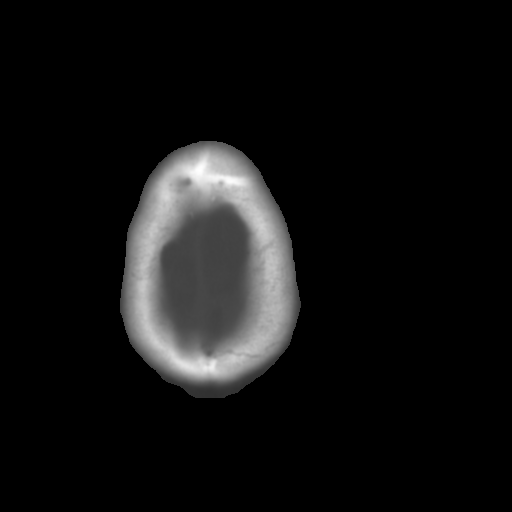

[Series 3: bone · axial · 0.42mm/px · z∈[+924,+970]mm · 3 of 32 slices shown]
[im 3/32  bone]
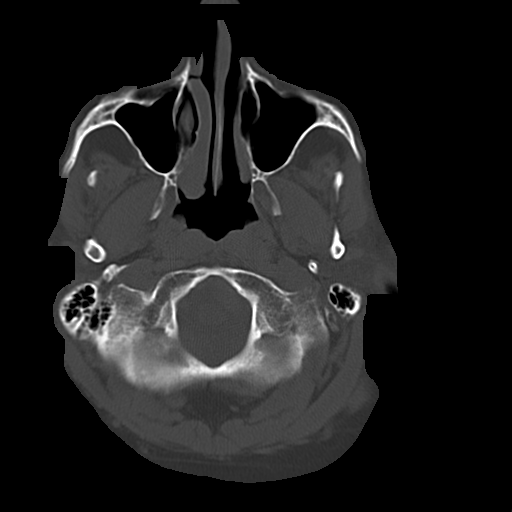
[im 7/32  bone]
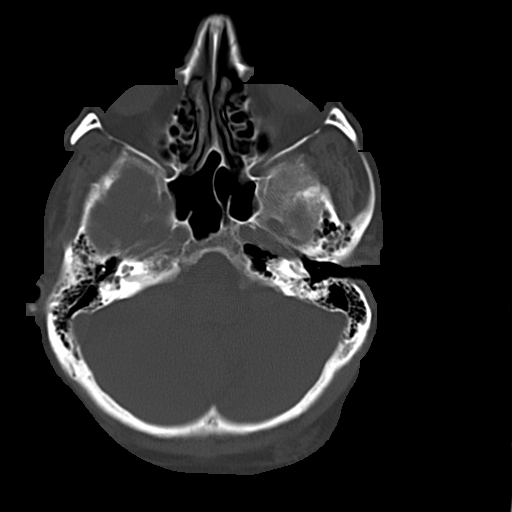
[im 12/32  bone]
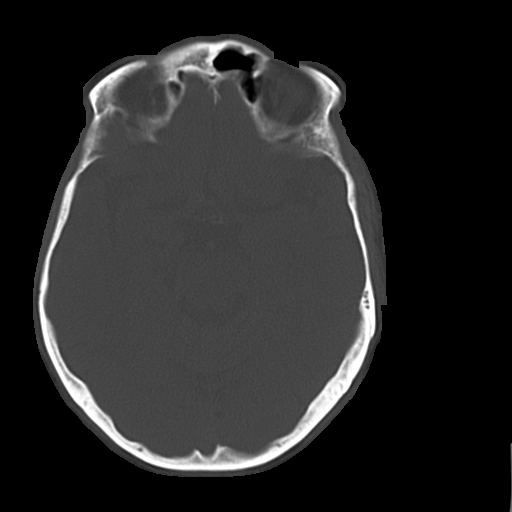

[16 of 30 positions shown; findings below may reference images not displayed]

PROCEDURE:     CT  - CT HEAD WITHOUT CONTRAST  - March 04, 2013  [DATE]

RESULT:     Noncontrast emergent CT of the brain is performed in the
standard fashion. There is prominence of the ventricles and sulci consistent
with atrophy. There is no intracranial hemorrhage, mass, mass effect or
midline shift. No evolving infarct is present. The calvarium appears intact.
The sinuses and mastoid air cells are clear.
IMPRESSION: 1. Atrophy with chronic microvascular ischemic disease. No acute
intracranial abnormality.

[REDACTED]

## 2014-09-11 IMAGING — CR RIGHT HAND - COMPLETE 3+ VIEW
1 series · 2 of 2 positions shown · non-contrast
Comparison: none

REASON FOR EXAM: infection
COMMENTS:   Bedside (portable):Y

[Series 1: oblique · 0.17mm/px · 2 of 2 slices shown]
[im 1/2]
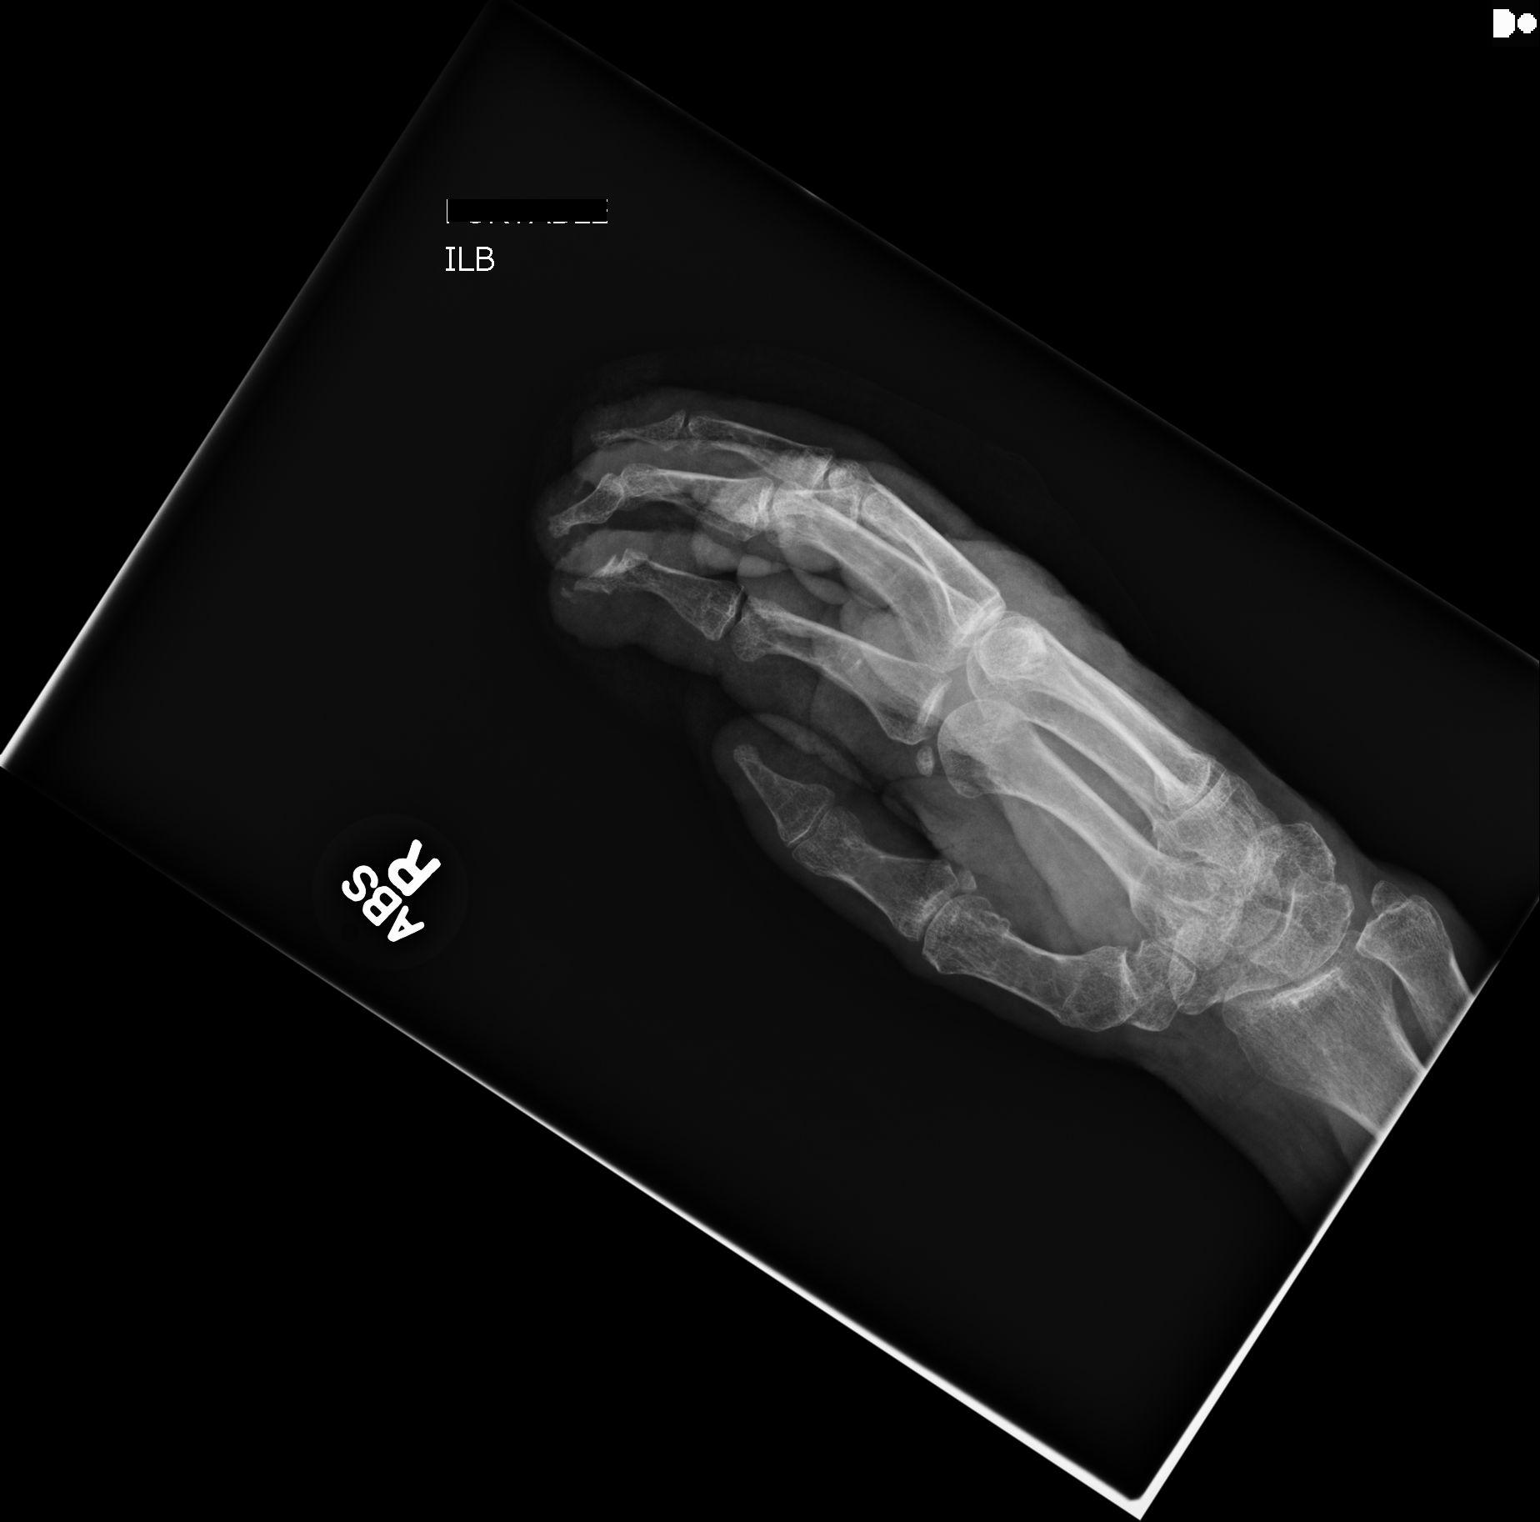
[im 2/2]
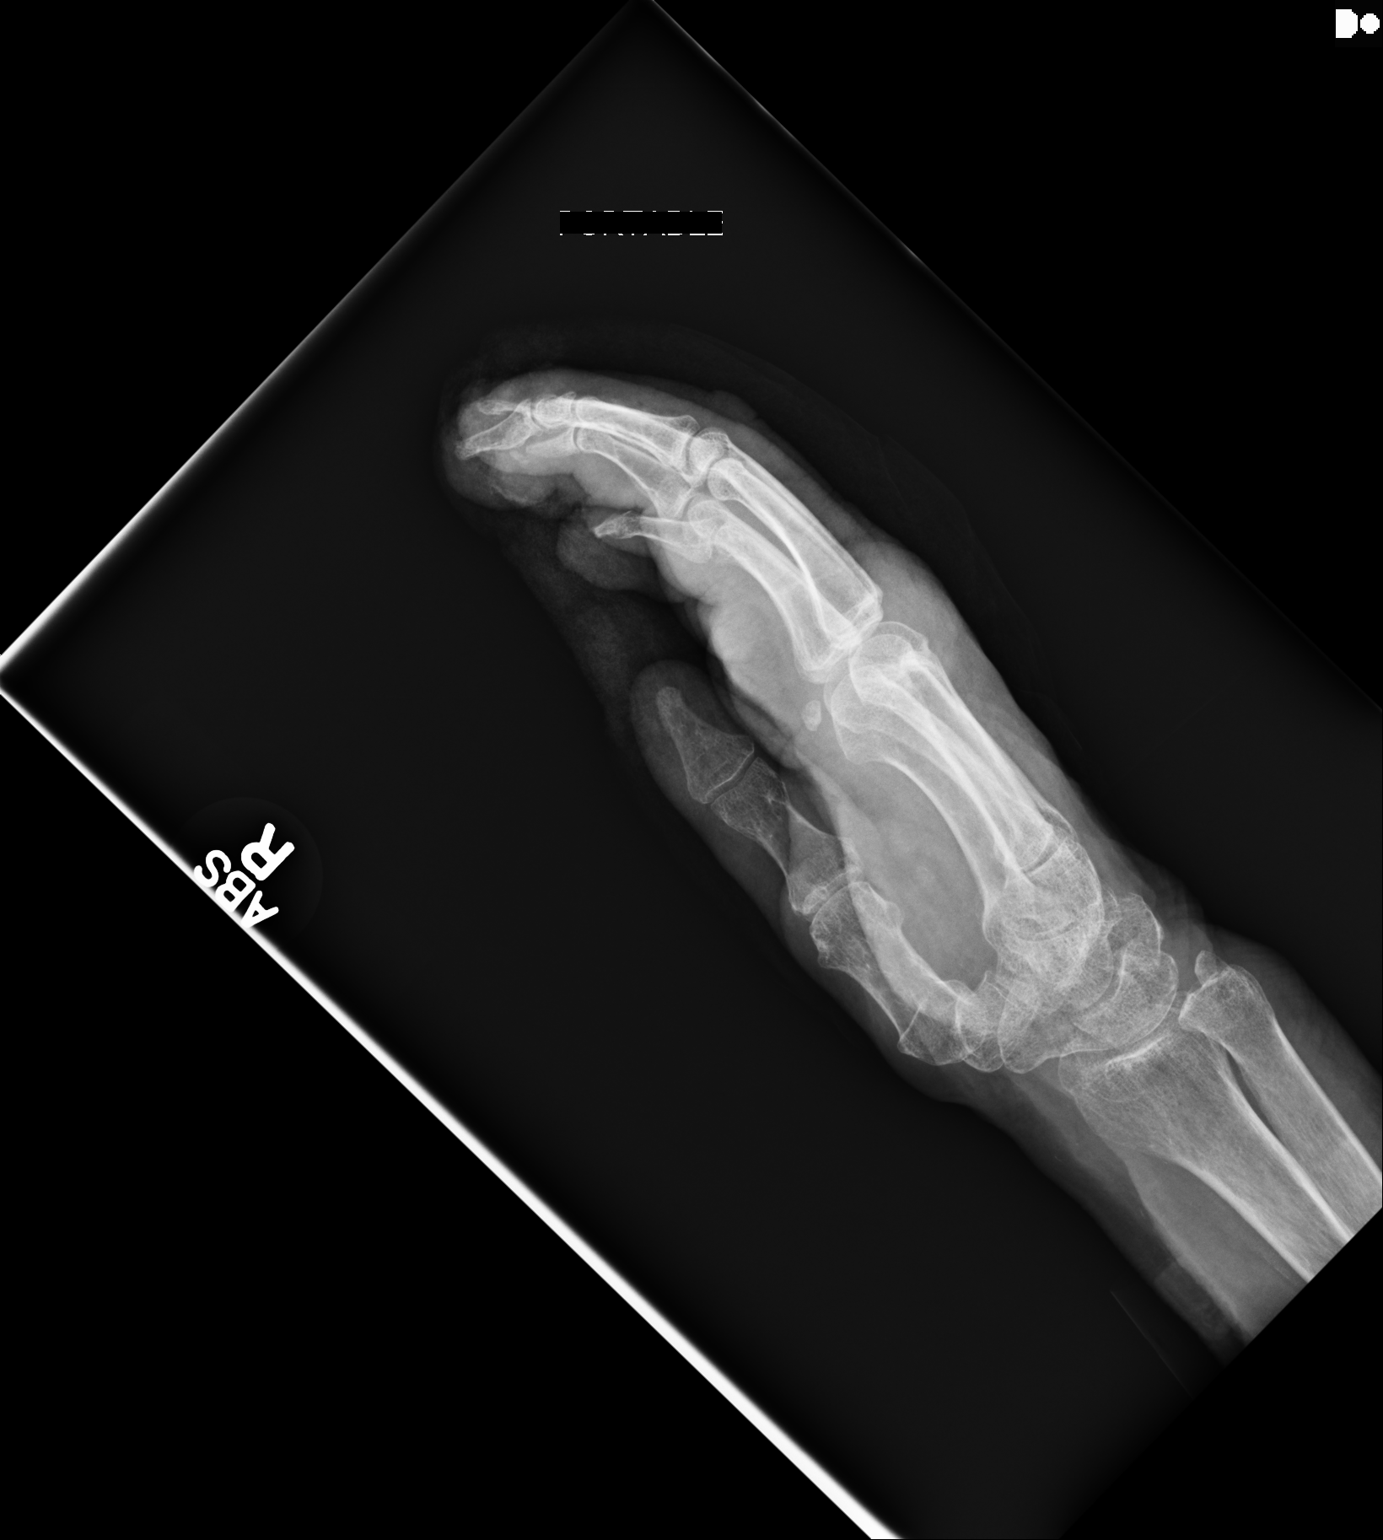

[2 of 2 positions shown; findings below may reference images not displayed]

PROCEDURE:     DXR - DXR HAND RT COMPLETE W/OBLIQUES  - March 05, 2013  [DATE]

RESULT:     AP and lateral views of the right hand are present. Positioning
is limited due to the patient's inability to remain still and also due to
the presence of bandages.

The bones are osteopenic. There is no definite evidence of a fracture or
dislocation. As best as can be determined there are no bodies to suggest
osteomyelitis of the metacarpals nor proximal and middle phalanges. There is
thinning of the distal phalanges of the index, fourth, and fifth fingers
which is associated with overlying soft tissue swelling. This could reflect
the presence of osteomyelitis. No definite soft tissue gas collections are
demonstrated. The bones of the wrist are grossly intact.
IMPRESSION: There appears to be bony resorption of the distal phalanges
of the index, fourth, and fifth finger is which may reflect osteomyelitis.
There is overlying soft tissue swelling.

[REDACTED]

## 2014-10-15 NOTE — Consult Note (Signed)
PATIENT NAME:  Kenneth Vang, Kenneth Vang MR#:  [id number] DATE OF BIRTH:  1945/10/08  DATE OF CONSULTATION:  03/04/2013  CONSULTING PHYSICIAN:  Gonzella Lex, MD  IDENTIFYING INFORMATION AND REASON FOR CONSULT: A 69 year old man with chronic paralysis who was brought to the hospital with sepsis and also having injured his own right hand by biting. Consult is apparently focused on this behavior.   HISTORY OF PRESENT ILLNESS: Information obtained from the patient and the chart. I attempted to reach his wife and was not able to get anyone on the phone. The patient tells me that when he woke up this morning he was biting his hand. He says that he was not intending to hurt himself and that he cannot really remember even why he was doing it, and he describes it as feeling "like a dream." He speculates that possibly it was because his hand was uncomfortable. He was brought to the hospital where he was diagnosed with a septic infection, probably related to his urinary tract. His hand apparently is quite badly injured. I did not unwrap that, but I see from the surgery note, it badly enough injured that it could require amputation of at least a finger. The patient tells me that he does not having any psychiatric or mental health complaints. He says his mood generally is pretty good. He says he long ago adjusted to his unfortunate disability and does not let it get him down. Most of the time he sleeps reasonably well. He says he is happy at home with his wife and his live in home health care aid. He denies any psychotic symptoms. Absolutely denies any thoughts of wanting to hurt or kill himself.   PAST PSYCHIATRIC HISTORY: The patient denies ever having had any psychiatric problems or treatment in the past. There is no evidence of it in the chart. What I do evidence of his is a couple of years ago, he was in the hospital with the same kind of situation, that is to say that when he was delirious from an infection, he was  biting his hand, although it looks like he did not do as much as damage last time. Therefore, this seems like this is a recurrent behavior that happens when he gets delirious.   PAST MEDICAL HISTORY: The patient suffered a traumatic injury from a fall in 2005. At that time he was described as being a quadriplegic. It seems like he is regained a little bit of use of his right hand and arm. His left arm has only slight motion to it. The patient has an indwelling suprapubic catheter because of his paralysis. He has had recurrent urinary tract infections as one would probably expect from having this kind of indwelling catheter. 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 Hypertension and hyperlipidemia.   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 He is married and lives with his wife. He says his home life is  pretty good. He does not feel bad about his situation. He has a live-in home health aide.   SUBSTANCE ABUSE HISTORY: Totally denies any alcohol or drug abuse now or in the past.   REVIEW OF SYSTEMS: He tells me that he does not have any pain. He apparently has little or no sensation in his arms or the rest of his body anyway, which would account for how he could injury as he  so badly without it bothering him. He says his mood is feeling okay. Denies depression, denies hallucinations, denies suicidal or homicidal ideation.   MENTAL STATUS EXAMINATION: The patient is awake and alert and interactive. He makes good eye contact. Given that he is mostly paralyzed, he has normal psychomotor activity. His speech is a little difficult to understand at times, but is normal in productive amount. His affect seems to be euthymic and appropriate. Mood is stated as being fine. His thoughts ramble a little bit and some of his stories get easily off track, but he did not make any obviously bizarre statements. He was able to stay  on track throughout our conversation. He did not seem to wax and wane and overall did not seem to be showing any signs of delirium. He denies any hallucinations. He totally denies suicidal or homicidal ideation. I did not do full cognitive testing with him. He was oriented to where he was and to the year. He says he does not keep up with the date normally.   ASSESSMENT: A 69 year old man who was biting his right hand. The patient has fairly unusual circumstances that he is largely quadriplegic with decreased feeling in his body as well. He was delirious when he came into the hospital, which was almost certainly due to his urinary tract infection. Equally certainly he has improved a great deal since then, since he is not showing any delirium now. I see from the chart that he has done the same sort of thing in the past when he was also delirious. I tried to get his wife on the phone to confirm that he does not have any self-injuring behaviors when he is in his right mind, but I have not been able to reach her. He denies that he has any such behaviors and I will that is true, since I have do not see any record of it. I do not think that this patient has any kind of psychiatric illness specific to self-injury and I do not think that he is a suicide risk.   TREATMENT PLAN: No indication for any treatment at this point. If he was still delirious, the usual use of antipsychotics could be helpful, but right now that does not seem to be an immediate issue. I can go ahead and put in an order for IV doses of haloperidol just in case something like that were needed. Otherwise, no psychiatric treatment indicated at this point.   DIAGNOSIS, PRINCIPAL AND PRIMARY:   AXIS I: Delirium due to sepsis, resolving.   DIAGNOSIS:    AXIS I: No further.   AXIS II: No diagnosis.   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   AXIS IV:  Severe chronic stress from his disability.   AXIS V: Functioning at time of evaluation 15.   ____________________________ Gonzella Lex, MD jtc:cc D: 03/04/2013 20:06:31 ET T: 03/04/2013 20:26:33 ET JOB#: [id number]  cc: Gonzella Lex, MD, <Dictator> Gonzella Lex MD ELECTRONICALLY SIGNED 03/04/2013 21:11

## 2014-10-15 NOTE — Consult Note (Signed)
PATIENT NAME:  Kenneth Vang, Kenneth Vang MR#:  [id number] DATE OF BIRTH:  05/31/1946  DATE OF CONSULTATION:  03/04/2013  REFERRING PHYSICIAN:   CONSULTING PHYSICIAN:  Simonne Come. Inez Pilgrim, MD  HISTORY OF PRESENT ILLNESS: Mr. Burkett is a 69 year old patient who was admitted today, September 10th. Oncology is consulted due to an apparent colorectal cancer and pulmonary metastasis. The pertinent history is this patient was admitted today with confusion and agitation that was thought to be due to acute cystitis. He has bacteria and abnormal white cells in his urine in the suprapubic cystostomy although he had not had any clear fever or chills. He has a history of prior urinary tract infection, prior sepsis. He recently had MRSA and pseudomonas shown in the urine back in August. He received antibiotics including Unasyn,  Levaquin and Zyvox as well as dose of Rocephin. He may have in addition or as a prior pathology of neuropsychiatric disorder as he has had some self-mutilating behavior. He has been biting his hand, chewing on his fingers. He has macerated fingers in the right hand. He has an infected index finger on the right hand. He has been seen by orthopedics. He is going to be seen by psychiatry. He is alert and cooperative, yet he has this persistent behavior which is unusual, clearly irrational. It is unclear if he actually had any other delirium or definite signs of infection. He was sedated earlier in his hospitalization but he was awake and alert when I spoke with him.   He has a past history of paraplegia with spinal cord injury 2005. He has no use of the lower extremities, no bowel or bladder function. He has no use of the left arm. He has impaired use of the right arm but no coordination in the right hand. He was limited in movements of the shoulders. He has chronic lower extremity edema, history of recurrent DVTs, history of an IVC filter and chronic Coumadin maintenance. He also has a history of splenic  rupture, also hyperlipidemia, status post splenectomy, status post pacemaker and also cataracts. He has had gastrostomy in the past. He currently swallows. He has had tracheostomy in the past, currently his respiration is unimpaired.   ALLERGIES: MORPHINE AND VANCOMYCIN.   SOCIAL HISTORY:  He has no history of alcohol or smoking. Lives with his wife and has home health nurses assisting in the home.   FAMILY HISTORY: As indicated, he had pancreatic cancer.   MEDICATIONS:  He has been on Coumadin 3 mg daily, Vantin 200 mg twice a day, Travatan eyedrops, baclofen 20 mg 4 times a day and 10 mg nitroglycerin patch.   On questioning, he answered most questions but he was somewhat inattentive but he was  denying that he had any headache and he was not in pain. He was not short of breath, not having chest pain, not having any abdominal pain. His hand did not hurt. He was not coughing.   PHYSICAL EXAMINATION GENERAL: He was alert, slightly distracted but cooperative. He did not appear in acute distress.  HEENT: Sclerae no jaundice.  Mouth no thrush.  LYMPH: No palpable lymph nodes in the neck, supraclavicular, submandibular,  LUNGS: Clear. No wheezing or rales.  HEART: Regular.  ABDOMEN: Nontender. No palpable mass or organomegaly.  EXTREMITIES:  He has contractions in the feet and lower extremity, symmetric 2 to 3+ edema. He has no movement in the left arm. He has some spastic movements in the right arm. He has maceration of the hands  and fingers of the right hand and a cellulitis in the right index finger. SKIN:  He had no rash.   LABS: On admission, his creatinine was 1.49. Liver functions were normal. Albumin was 2.8. White count was 22, hemoglobin 12.6, platelets 484. Urine had positive esterase, positive blood, high white cells.   Significant additional past and recent history is that the patient had had a CT scan of the abdomen and pelvis recently on July 1st to investigate hematuria and he was  found to have cysts or complex cyst in the kidney, a lesion of the base of the cecum that appeared to involve the ureter and cause compression externally. 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 He underwent a colonoscopy and had a cecal mass that was biopsied. Pathology showed cells with extracellular mucin but not clearly malignant cells. 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 Other histologies are possible. Second malignancy of the lung is possible. Potentially malignancy in 1 of the renal cysts is possible with or without metastasis to the lung. He has had ureter stented.   IMPRESSION AND PLAN: The patient is with apparent underlying metastatic colon cancer, but no confirmatory histology. He also has significant comorbidities as described. In addition to severe disability and a performance status of 4, he has unusual behavior, possibly some neuropsychiatric disorder and self-mutilating activity for which he will have psychiatry followup. He is not actively bleeding. His laboratory studies are unremarkable except for leukocytosis. Nothing acutely to be done from oncology but decision about whether or not he could ever  undergo a course of antineoplastic treatment would depend on his clinical status and the patient and family wishes. Very poor candidate for any aggressive therapy given his performance status and comorbidities. Will watch how he goes with this current medical course and will follow up as an outpatient.   ____________________________ Simonne Come. Inez Pilgrim, MD rgg:cs D: 03/04/2013 19:55:24 ET T: 03/04/2013 20:12:05 ET JOB#: [id number]  cc: Simonne Come. Inez Pilgrim, MD, <Dictator> Dallas Schimke MD ELECTRONICALLY SIGNED 03/25/2013 11:25

## 2014-10-15 NOTE — Consult Note (Signed)
Brief Consult Note: Diagnosis: delirium due to sepsis - resolving.   Patient was seen by consultant.   Consult note dictated.   Comments: Psychiatry: PAtient seen and chart reviewed. PAtient was delirious on admission and had bitten his hand. Judging from his old chart this is a recurrent behavior for him when he is septic. Currently he seems much better. His stories ramble a bit but he doesn't seem really delirious and he has no wish to hurt himself or his hand. No treatment suggestions. Wrapping his hand is a good idea, though.  Electronic Signatures: Clapacs, Madie Reno (MD)  (Signed 10-Sep-14 19:43)  Authored: Brief Consult Note   Last Updated: 10-Sep-14 19:43 by Gonzella Lex (MD)

## 2014-10-15 NOTE — Consult Note (Signed)
Brief Consult Note: Diagnosis: Clinically colon cancerwith pulmonry metastasis no pathologic confirmation.   Patient was seen by consultant.   Consult note dictated.   Comments: See dictated note. recent CT scans consistent with colon cancer, colonoscopy found a mass, bx were non diagnostic.  SIGNIFICANT COMOBIDITY, PARAPLEGIA, LEFT ARM PARALYSIS, SOME USE OF RIGHT ARM, HAS SUPRAPIBIC CATH, HX RECURRENT UTI, CURRENT ADMISSION AMS, THOUGH TO BE INFECTION. MAY HAVE NEUROPSYCHIATRIC DISORDER AS WELL, HAS SELF MUTILATING BEHAVIOR/HAND BITING. HAS CELLULITIS INDEX FINGER. NON CONTRAST HEAD CT NO MASS . RE underlying malignancy, his poor performance status limits benefits of tx, also currently no pathology. Currently nothing acutely from oncology, will f/u as out patient, will continue discussions with patient and family in future , if any antineoplastic tx will be given.  Electronic Signatures: Dallas Schimke (MD)  (Signed 10-Sep-14 19:36)  Authored: Brief Consult Note   Last Updated: 10-Sep-14 19:36 by Dallas Schimke (MD)

## 2014-10-15 NOTE — Op Note (Signed)
PATIENT NAME:  Kenneth Vang, Kenneth Vang MR#:  [id number] DATE OF BIRTH:  1946/06/17  DATE OF PROCEDURE:  03/10/2013  PREOPERATIVE DIAGNOSIS: Osteomyelitis, index, long, ring and little fingers with extensive cellulitis, index finger.   POSTOPERATIVE DIAGNOSIS:  Osteomyelitis, index, long, ring and little fingers with extensive cellulitis, index finger.   PROCEDURE:  Index ray amputation, amputation to the DIP joint of the long, ring and little fingers.   ANESTHESIA:  General.   SURGEON:  Laurene Footman, M.D.   DESCRIPTION OF PROCEDURE:  The patient was brought to the operating room and after adequate anesthesia was obtained, the right arm was prepped and draped in the usual sterile fashion. After patient identification and timeout procedures were completed, the tourniquet was raised to 250 mmHg. The index finger was taken care of first with the racquet incision extending proximally towards the base of the second metacarpal. The skin was incised down to the bone with this flap elevated off the bone, and the finger amputated through the MCP joint, excised going through the capsule at that level.  Next, the metacarpal was visualized with an extraperiosteal dissection, and at the base of the metacarpal, a small saw was used to cut through the base at an oblique angle. The wound was thoroughly irrigated. The tourniquet was let down to make sure the digital vessels had been adequately cauterized. When this was completed, attention was turned to the other fingers with the tourniquet again raised, going sequentially with identical treatment. Dorsal incision down at the level of the DIP joint and then a volar incision allowing for maintenance of the volar flap of the pulp, excision of the bone of the distal phalanx. The flexor and extensor tendons were retracted and released. The small vessels cauterized, and the end of the middle phalanx debrided so it would not be bulbous at the end of the amputation. This was done  through the long, ring and then little fingers. These were all then thoroughly irrigated. The tourniquet was left up at this point. The wounds were then closed with simple interrupted 4-0 nylon skin sutures for the fingers, 2-0 nylon was used to close the periosteal sleeve from the second metacarpal and capsule, and then 4-0 nylon in a simple interrupted fashion for the skin incision. A dressing with Xeroform, bulky fluffs, volar and dorsal splints to hold the hand still after Webril had been applied, followed by bias wrap, Ace wrap and padding. The patient  was sent to recovery in stable condition.  Tourniquet time was 40 minutes.  EBL was 25.  There were no complications.   SPECIMEN: Resected fingers, right hand.    ____________________________ Laurene Footman, MD mjm:dmm D: 03/10/2013 19:34:06 ET T: 03/10/2013 22:41:33 ET JOB#: [id number]  cc: Laurene Footman, MD, <Dictator> Laurene Footman MD ELECTRONICALLY SIGNED 03/11/2013 8:12
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
## 2014-10-15 NOTE — Consult Note (Signed)
Brief Consult Note: Diagnosis: status post amputation.   Comments: Patient recently seen in the office. Will notify Dr. Rudene Christians of patient's admission.  Electronic Signatures: Dereck Leep (MD)  (Signed 29-Sep-14 19:11)  Authored: Brief Consult Note   Last Updated: 29-Sep-14 19:11 by Dereck Leep (MD)

## 2014-10-15 NOTE — H&P (Signed)
PATIENT NAME:  Kenneth Vang, Kenneth Vang MR#:  [id number] DATE OF BIRTH:  20-Dec-1945  DATE OF ADMISSION:  03/04/2013  PRIMARY CARE PHYSICIAN:  Dr. Dannette Barbara.  REFERRING PHYSICIAN:  Dr. Owens Shark.   CHIEF COMPLAINT:  Altered mental status.   HISTORY OF PRESENT ILLNESS:  The patient is a 70 year old male with a past medical history of paraplegia after a spinal cord injury in the year 2005, status post indwelling suprapubic Foley catheter, history of recurrent DVTs, history of recurrent UTIs with a past history of UTI with Proteus and E. coli sensitive to Levaquin, hyperlipidemia, hypertension, status post pacemaker, is presenting to the ER with a chief complaint of altered mental status for the past three days.  According to the wife, the patient was quite confused and he ate all his right hand nail beds completely because of altered mental status and agitation.  The patient was brought into the ER.  In the ER, the patient was diagnosed with acute cystitis and he has received IV Rocephin.  CAT scan of the head was also done which was negative for any acute findings.  Hospitalist team is called to admit the patient.  During my examination, the patient is in a deep sleep as he just received Ativan 1 mg IV push and I was unable to get any history from him.  History is obtained from the patient's wife at bedside.   PAST MEDICAL HISTORY:  Paraplegia after a spinal cord injury in the year 2005, recurrent DVTs, hyperlipidemia, hypertension and recurrent UTIs, splenic rupture.   PAST SURGICAL HISTORY:  Pacemaker placement and status post splenectomy.  IVC filter, cataract surgery, tracheostomy, gastrostomy, left toe amputation.   ALLERGIES:  MORPHINE AND VANCOMYCIN.   PSYCHOSOCIAL HISTORY:  Lives with wife.  Home health nurse takes care of him 5 days a week.  No history of smoking or alcohol use according to the old medical records.   FAMILY HISTORY:  Father had pancreatic cancer and hypertension.  Mother,  history of  hypertension.   HOME MEDICATIONS:  Coumadin 3 mg once daily, Vantin 200 mg 2 times a day, Travatan eyedrops, Coumadin 1 mg once daily with the 3 mg dose, baclofen 20 mg 4 times a day, nitroglycerin 10 mg once daily.   REVIEW OF SYSTEMS:  Unobtainable as the patient with altered mental status.   PHYSICAL EXAMINATION:  VITAL SIGNS:  Temperature 98.6, pulse 72, respirations 16, blood pressure 112/72, pulse ox 94%.  GENERAL APPEARANCE:  Not under acute distress, resting comfortably as he just received IV Ativan.  HEENT:  Normocephalic, atraumatic.  Pupils are equally reacting to light and accommodation.  No scleral icterus.  No sinus tenderness.  Dry mucous membranes.  NECK:  Supple.  No JVD.  No thyromegaly.  LUNGS:  Clear to auscultation bilaterally.  No accessory muscle usage.  No anterior chest wall tenderness on palpation.  CARDIAC:  S1, S2 normal.  Regular rate and rhythm.  No murmurs.  GASTROINTESTINAL:  Soft.  Bowel sounds are positive in all four quadrants.  Nontender, nondistended.  No hepatosplenomegaly.  Intact suprapubic catheter.  NEUROLOGIC:  Very lethargic as he received IV Ativan, arousable with pain stimuli, but falling asleep.  Motor and sensory could not be elicited as the patient is with altered mental status.  EXTREMITIES:  No edema.  No cyanosis.  No clubbing.  Reflexes are 2+.  The right upper extremity nail beds are completely eaten by the patient.  No bleeding or discharge is noticed at this time.  SKIN:  Warm to touch, dry in nature.  PSYCHIATRIC:  Mood and affect could not be elicited as the patient is with altered mental status.   LABORATORY AND IMAGING STUDIES:  Glucose 116, BUN 21, creatinine 1.49, sodium 135, potassium 3.6, chloride 104, CO2 22, GFR 49.  Serum osmolality 274, calcium 8.7.  LFTs are within normal range except albumin which is at 2.8.  WBC 22.1, hemoglobin 12.6, hematocrit 37.6, platelets of 484.  Urinalysis yellow in color, cloudy in  appearance, blood 3+, pH 6.0, nitrite positive, leukocyte esterase 3+, positive hyaline casts.   ASSESSMENT AND PLAN:   1.  Altered mental status secondary to acute cystitis with a past history of Proteus and Escherichia coli sensitive to Levaquin.  We will get cultures.  The patient will be on IV levofloxacin.  2.  Acute kidney injury.  Provide IV fluids and avoid nephrotoxins.  3.  Recurrent deep vein thromboses in the past, status post [place] filter placement.  Continue home medication Coumadin.  Daily PT INRs will be checked.  4.  Chronic history of paraplegia, status post suprapubic catheter.  5.  Hypertension and hyperlipidemia.  We will keep him nothing by mouth for now as patient is with altered mental status.  We will resume home medications once the patient's mental status is back to normal.  6.  We will provide him GI prophylaxis.   7.  DVT prophylaxis.  The patient is on Coumadin.   8.  CODE STATUS:  He is a FULL CODE according to his wife.  Wife is the medical power of attorney.   Diagnosis and plan of care was discussed in detail with the patient's wife at bedside.  She verbalized understanding of the plan.   Total time spent on the admission is 45 minutes.     ____________________________ Nicholes Mango, MD ag:ea D: 03/04/2013 06:05:43 ET T: 03/04/2013 06:53:33 ET JOB#: [id number]  cc: Nicholes Mango, MD, <Dictator> Nicholes Mango MD ELECTRONICALLY SIGNED 03/14/2013 7:16

## 2014-10-15 NOTE — Discharge Summary (Signed)
PATIENT NAME:  Kenneth Vang, Kenneth Vang MR#:  [id number] DATE OF BIRTH:  Jun 05, 1946  DATE OF ADMISSION:  03/20/2013 DATE OF DISCHARGE:  03/25/2013  REASON FOR ADMISSION: Altered mental status with cloudy urine.   DISCHARGE DIAGNOSES: 1.  Altered mental status due to metabolic encephalopathy secondary to urinary tract infection.  2.  Urinary tract infection.  3.  Catheter-related urinary tract infection with septic shock.  4.  Pseudomonas urinary tract infection and previous methicillin-resistant Staphylococcus aureus urinary tract infection on August 14. Seconds cultures only grew out Pseudomonas.  5.  History of deep vein thrombosis, on Coumadin.  6.  Right hand osteomyelitis due to the patient biting his own hand.   7.  Status post recent finger amputation.  8.  Paraplegia.  9.  Spinal cord injury.  10.  Bedbound. 11.  Hyperlipidemia.  12.  Dementia with agitation.  13.  Diarrhea with negative Clostridium difficile testing. 14.  Diaper rash dermatitis.   CODE STATUS: The patient is DNR. Discharged on hospice home care.   DISPOSITION: Home with hospice.   DISCHARGE MEDICATIONS: 1.  Sulamyd with Timolol 2%/0.5% one drop twice daily.  2.  Colace 100 mg twice daily. 3.  Atorvastatin 10 mg at bedtime. 4.  Travatan ophthalmic drops in affected eye every evening. 5.  Betamethasone with clotrimazole topical cream apply to affected area for fungal infection. 6.  Acetaminophen with oxycodone 5/325 mg as needed for pain. 7.  Risperidone 0.5 mg twice daily. 8.  Magnesium oxide 400 mg twice daily.  9.  Multivitamins once daily. 10.  Baclofen 20 mg 4 times daily. 11.  Warfarin 2.5 mg once a day. 12.  Amlodipine 5 mg once a day. 13.  Meropenem 500 mg every 8 hours for 10 days IV for treatment of pseudomonas urinary tract infection. 14.  Nystatin powder to affected area.  15.  Restoril 15 mg at bedtime.   DISCHARGE INSTRUCTIONS: Follow up with Dr. Skip Estimable from [facility]  and Dr. Thereasa Distance who is the primary care physician.   HOSPITAL COURSE: This is a very nice 69 year old gentleman who has history of being discharged from the hospital on September 18th. Came back to the hospital with altered mental status. The patient is bedbound as he had a spinal cord injury for what he is paraplegic. His caregiver is his wife. Apparently the patient started developing diarrhea after discharge from the hospital and his urine started becoming cloudy. He became a little bit less responsive and more confused than his baseline for 2 to 3 days for what the patient was evaluated. The patient came into the ER where his white count was elevated. His urine white count was elevated as well, and he was diagnosed with a UTI. In the past, he cultured, on 09/14, MRSA and pseudomonas, and he was recently discharged on Augmentin and doxycycline. The patient is admitted for treatment of UTI.  AS PER PROBLEMS: 1.  Altered mental status. This was likely secondary to a urinary tract infection since the patient had a previous history of MRSA in his urine and pseudomonas. The patient was started on Zyvox and [place]. His cultures were sent and it was noticed that only pseudomonas grew up on this hospitalization for what the Zyvox was stopped. The pseudomonas was intermittent to [facility] for what that medication was changed and the patient was put on meropenem as the sensitivities showed that the bacteria was sensitive to imipenem. Blood cultures were negative. C. diff was negative. His white  count on urine was 1100. His white count on blood was 19,000. The patient was seen by Dr. Marry Guan as he needed to be evaluated for his previous surgery, but there was not any major changes in the condition. There were no signs of infection on his amputation. The patient underwent PICC line placement for continuation of treatment with IV antibiotics at home, and he was discharged on hospice. He has had hospice in the past  and the family was open to just taking him home. His altered mental status was due to metabolic encephalopathy and started clearing up. He was at baseline confused due to his dementia though.  2.  He has history of DVT for what he was kept on Coumadin. His INR was stable.  3.  His right hand osteomyelitis needs to be followed up with [place] in the next 4 days after discharge for change on his dressings.  4.  The patient has history of metastatic colon cancer and is being seen by [facility] oncology. He has an appointment to see them next month. There were no major issues related to this during this hospitalization.  5.  He had a rash in the diaper area which was treated and overall the patient was discharged in very poor condition, but this is his baseline condition.  6.  PICC line was placed. PICC line care by hospice care.   TIME SPENT: About 40 minutes with this discharge on the day of discharge.  ____________________________ [place] Sink, MD rsg:sb D: 03/27/2013 07:01:15 ET T: 03/27/2013 07:45:52 ET JOB#: [id number]  cc: [place] Sink, MD, <Dictator> Laurice Record. Holley Bouche., MD Sofie Hartigan, MD Roselie Awkward America Brown MD ELECTRONICALLY SIGNED 03/28/2013 14:52
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
## 2014-10-15 NOTE — Consult Note (Signed)
Brief Consult Note: Diagnosis: infection right hand.   Patient was seen by consultant.   Comments: will reassess tomorrow if index finger does not improve, may requir amputation if improving with iv medsw, possilbe cast to prevent patient from mutilationg fingers.  Electronic Signatures: Laurene Footman (MD)  (Signed 10-Sep-14 17:37)  Authored: Brief Consult Note   Last Updated: 10-Sep-14 17:37 by Laurene Footman (MD)

## 2014-10-15 NOTE — Op Note (Signed)
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   POSTOPERATIVE DIAGNOSIS: No metastasis possible. No obstruction with possible external pressure, but no intrinsic mass seen. No hydronephrosis seen.   PROCEDURE: Cysto, right retrograde pyelogram, stent placement without difficulty.   SURGEON: Dene D. Elnoria Howard, DO    ANESTHESIA: General.  The patient was sterilely prepped and draped in supine lithotomy position for ease of approach to the external genitalia in this quadriplegic and I keep the suprapubic catheter to gravity so we do not have any bradycardia with hypertension. The cystoscopy was done. A highly trabeculated bladder is seen but by some good fortune, I am able to view the right ureter, which is probably a refluxing ureter. I am able to instrument it, despite the completely deflated bladder. I place a 5-French ureteral catheter, do a right retrograde pyelogram. There are some areas that appear to be reacting to external pressure, but the contrast easily goes up the ureter, with no intrinsic tumor seen. I am able to pass an 0.038 Glidewire easily up the ureter into the kidney. There is no hydronephrosis in the kidney. Multiple films are taken. Then, I easily place a 6-French 24 cm stent up the ureter under direct vision. It curls well in the bladder and in the kidney. The patient tolerates the procedure well. The bladder of course does not need to be emptied. A B and O suppository is placed. The patient is sent to recovery in satisfactory condition.    ____________________________ Janice Coffin. Elnoria Howard, DO rdh:jm D: 02/09/2013 13:56:35 ET T: 02/09/2013 15:21:32 ET JOB#: [id number]  cc: Janice Coffin. Elnoria Howard, DO, <Dictator> Giannis D HART DO ELECTRONICALLY SIGNED 02/12/2013 11:12

## 2014-10-15 NOTE — Op Note (Signed)
PATIENT NAME:  Kenneth Vang, Kenneth Vang MR#:  [id number] DATE OF BIRTH:  [birth year]  DATE OF PROCEDURE:  03/25/2013  PREOPERATIVE DIAGNOSES: 1.  Urinary tract infection with methicillin resistant staph aureus.  2.  Acute encephalopathy.   POSTOPERATIVE DIAGNOSES: 1.  Urinary tract infection with methicillin resistant staph aureus.  2.  Acute encephalopathy.   PROCEDURES: 1.  Ultrasound guidance for vascular access to left brachial vein.  2.  Fluoroscopic guidance for placement of catheter.  3.  Insertion of peripheral venous catheter double-lumen PICC line, left arm.   SURGEON: Katha Cabal, MD  INDICATION FOR PROCEDURE: Requiring IV antibiotics greater than 5 days.   ANESTHESIA: Local.   ESTIMATED BLOOD LOSS: Minimal.   DESCRIPTION OF PROCEDURE: The patient's left arm was sterilely prepped and draped, and a sterile surgical field was created. The brachial vein was accessed under direct ultrasound guidance without difficulty with a micropuncture needle and permanent image was recorded. 0.018 wire was then placed into the superior vena cava. Peel-away sheath was placed over the wire. A single lumen peripherally inserted central venous catheter was then placed over the wire and the wire and peel-away sheath were removed. The catheter tip was placed into the superior vena cava and was secured at the skin at 41 cm with a sterile dressing. The catheter withdrew blood well and flushed easily with heparinized saline. The patient tolerated procedure well.   ____________________________ Katha Cabal, MD ggs:dp D: 03/31/2013 09:47:48 ET T: 03/31/2013 10:04:00 ET JOB#: [id number]  cc: Katha Cabal, MD, <Dictator> Katha Cabal MD ELECTRONICALLY SIGNED 04/07/2013 17:53

## 2014-10-15 NOTE — Discharge Summary (Signed)
PATIENT NAME:  Kenneth Vang, Kenneth Vang MR#:  [id number] DATE OF BIRTH:  March 12, 1946  DATE OF ADMISSION:  03/04/2013 DATE OF DISCHARGE:  03/12/2013  ADMITTING DIAGNOSIS: Altered mental status.   DISCHARGE DIAGNOSES: 1.  Acute encephalopathy due to an acute infection.  2.  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  3.  Likely metastatic colon cancer with lung lesions with cecal mass with indeterminate biopsy.  4.  History of deep venous thrombosis, on chronic Coumadin.  5.  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  6.  Chronic indwelling Foley catheter.  7.  Hypokalemia, hypomagnesemia.  8.  Severe deconditioning and debility.  9.  Hyperlipidemia.  10.  Recurrent urinary tract infection.  11.  History of splenic rupture.  12.  Status post pacemaker placement.  13.  Status post splenectomy.  14.  Status post inferior vena cava filter placement.  15.  Status post cataract surgery.  16.  Previous history of tracheostomy.  17.  Previous history of gastrostomy.  18.  Status post left toe amputation in the past.   CONSULTANTS: Dr. Rudene Christians and palliative care.   PERTINENT LABS AND EVALUATIONS: Admitting glucose 116, BUN 21, creatinine 1.49, sodium 135, potassium 3.6, chloride 104, CO2 22. Magnesium 8.7. LFTs showed albumin of 2.8. WBC 22.1, hemoglobin 12.6 and platelet count 484. INR 2.1. Urine cultures: No growth. UA showed RBCs 3+, WBCs 138, 1+ bacteria.   CT scan of the chest without contrast showed numerous bilateral pulmonary nodules of varying sizes with largest in the left lower lobe measuring 2.8 x 2.1 cm.   CT of the head without contrast showed atrophy with chronic microvascular ischemic changes.   Chest x-ray showed no definite evidence of pneumonia or CHF.  HOSPITAL COURSE: Please refer to H and P done by the admitting physician. The patient is a 69 year old male with history  of paraplegia after spinal cord injury after a fall who has indwelling Foley catheter, history of recurrent DVTs, history of recurrent UTIs, who was brought to the ED with altered mental status. The patient was noted to have severe right hand human bite cellulitis and osteomyelitis and leukocytosis was noted. He was seen in consultation by Dr. Rudene Christians. He felt that the patient would likely need amputation. The patient underwent amputation of the fingers, as described above, on the 16th. He was continued on IV antibiotics. His mental status has significantly improved since admission. The patient also has a history of cecal mass and there is concern about metastatic disease. He was seen in consultation by Dr. Inez Pilgrim who felt that the patient needed outpatient follow-up. He also had some electrolyte imbalances that were corrected. During the hospitalization, he was seen by palliative care. Initially, they wanted him to be FULL CODE; subsequently, the family decided to make him DNR. At this time, he is doing much better and is stable for discharge.   DISCHARGE MEDICATIONS:  1.  Dorzolamide/timolol 1 drop each affected eye b.i.d. 2.  Colace 100 mg 1 tab p.o. b.i.d. 3.  Atorvastatin 10 mg at bedtime. 4.  Travatan Z 0.004% 1 drop to left eye daily. 5.  Procto-Kit apply topically to affected area daily as needed. 6.  Betamethasone/clotrimazole applied to effected area daily. 7.  Warfarin 4 mg daily. 8.  Acetaminophen/oxycodone 325/5 mg 1 tab p.o. q. 4 hours p.r.n.  9.  Risperidone 0.5 mg 1 tab p.o. b.i.d.  10.  Augmentin 1  tab p.o. q. 12 hours x 5 days. 11.  Doxycycline 100 mg 1 tab p.o. b.i.d. x 5 days. 12.  KCl 20 mEq 1 tab p.o. b.i.d. 13.  Mag oxide 400 mg 1 tab p.o. b.i.d.   DIET: Regular with puree consistency.   ACTIVITY: As tolerated.  HOME HEALTH: The patient is to have home hospice follow him.   DISCHARGE FOLLOWUP:  Followup with primary MD in 1 to 2 weeks. The patient wants a  second opinion  and wants to be seen at [facility] oncology so he is referred there. Follow-up with [facility] ortho in 7 days.   TIME SPENT: 35 minutes. ____________________________ Lafonda Mosses Posey Pronto, MD shp:sb D: 03/13/2013 08:15:18 ET T: 03/13/2013 09:13:02 ET JOB#: [id number]  cc: Stephanne Greeley H. Posey Pronto, MD, <Dictator> Alric Seton MD ELECTRONICALLY SIGNED 03/13/2013 13:04
